# Patient Record
Sex: Female | Born: 1950 | Hispanic: Yes | Marital: Married | Smoking: Never smoker
Health system: Southern US, Community
[De-identification: ages and names within clinical notes are randomized; demographics above are authoritative.]

## PROBLEM LIST (undated history)

## (undated) DIAGNOSIS — C801 Malignant (primary) neoplasm, unspecified: Secondary | ICD-10-CM

## (undated) DIAGNOSIS — C50919 Malignant neoplasm of unspecified site of unspecified female breast: Secondary | ICD-10-CM

## (undated) DIAGNOSIS — N390 Urinary tract infection, site not specified: Secondary | ICD-10-CM

## (undated) DIAGNOSIS — K759 Inflammatory liver disease, unspecified: Secondary | ICD-10-CM

## (undated) HISTORY — DX: Urinary tract infection, site not specified: N39.0

## (undated) HISTORY — DX: Inflammatory liver disease, unspecified: K75.9

## (undated) HISTORY — PX: BREAST SURGERY: SHX581

## (undated) HISTORY — PX: REDUCTION MAMMAPLASTY: SUR839

## (undated) HISTORY — PX: AUGMENTATION MAMMAPLASTY: SUR837

## (undated) HISTORY — PX: APPENDECTOMY: SHX54

---

## 2008-11-12 DIAGNOSIS — Z833 Family history of diabetes mellitus: Secondary | ICD-10-CM | POA: Insufficient documentation

## 2008-11-12 DIAGNOSIS — I10 Essential (primary) hypertension: Secondary | ICD-10-CM | POA: Insufficient documentation

## 2008-12-29 DIAGNOSIS — M7710 Lateral epicondylitis, unspecified elbow: Secondary | ICD-10-CM | POA: Insufficient documentation

## 2008-12-29 DIAGNOSIS — M7520 Bicipital tendinitis, unspecified shoulder: Secondary | ICD-10-CM | POA: Insufficient documentation

## 2011-05-10 HISTORY — PX: MASTECTOMY: SHX3

## 2012-02-20 DIAGNOSIS — R7302 Impaired glucose tolerance (oral): Secondary | ICD-10-CM | POA: Insufficient documentation

## 2013-12-16 DIAGNOSIS — M5430 Sciatica, unspecified side: Secondary | ICD-10-CM | POA: Insufficient documentation

## 2013-12-16 DIAGNOSIS — M51369 Other intervertebral disc degeneration, lumbar region without mention of lumbar back pain or lower extremity pain: Secondary | ICD-10-CM | POA: Insufficient documentation

## 2013-12-16 DIAGNOSIS — M50321 Other cervical disc degeneration at C4-C5 level: Secondary | ICD-10-CM | POA: Insufficient documentation

## 2013-12-16 DIAGNOSIS — M545 Low back pain, unspecified: Secondary | ICD-10-CM | POA: Insufficient documentation

## 2015-05-06 DIAGNOSIS — E66811 Obesity, class 1: Secondary | ICD-10-CM | POA: Insufficient documentation

## 2015-11-04 DIAGNOSIS — G8929 Other chronic pain: Secondary | ICD-10-CM | POA: Insufficient documentation

## 2016-08-04 DIAGNOSIS — M150 Primary generalized (osteo)arthritis: Secondary | ICD-10-CM | POA: Insufficient documentation

## 2017-10-31 ENCOUNTER — Encounter (HOSPITAL_COMMUNITY): Payer: Self-pay

## 2017-10-31 ENCOUNTER — Emergency Department (HOSPITAL_COMMUNITY)
Admission: EM | Admit: 2017-10-31 | Discharge: 2017-11-01 | Disposition: A | Discharge status: Home / Self Care | Payer: BC Managed Care – PPO | Source: Home / Self Care | Attending: Emergency Medicine | Admitting: Emergency Medicine

## 2017-10-31 ENCOUNTER — Emergency Department (HOSPITAL_COMMUNITY): Payer: BC Managed Care – PPO

## 2017-10-31 DIAGNOSIS — Z853 Personal history of malignant neoplasm of breast: Secondary | ICD-10-CM | POA: Insufficient documentation

## 2017-10-31 DIAGNOSIS — J181 Lobar pneumonia, unspecified organism: Secondary | ICD-10-CM | POA: Insufficient documentation

## 2017-10-31 DIAGNOSIS — J189 Pneumonia, unspecified organism: Secondary | ICD-10-CM

## 2017-10-31 DIAGNOSIS — R000 Tachycardia, unspecified: Secondary | ICD-10-CM | POA: Insufficient documentation

## 2017-10-31 HISTORY — DX: Malignant (primary) neoplasm, unspecified: C80.1

## 2017-10-31 LAB — COMPREHENSIVE METABOLIC PANEL
ALBUMIN: 3.5 g/dL (ref 3.5–5.0)
ALT: 18 U/L (ref 0–44)
ANION GAP: 9 (ref 5–15)
AST: 20 U/L (ref 15–41)
Alkaline Phosphatase: 61 U/L (ref 38–126)
BUN: 8 mg/dL (ref 8–23)
CO2: 20 mmol/L — AB (ref 22–32)
Calcium: 8.6 mg/dL — ABNORMAL LOW (ref 8.9–10.3)
Chloride: 103 mmol/L (ref 98–111)
Creatinine, Ser: 0.75 mg/dL (ref 0.44–1.00)
GFR calc non Af Amer: >60 mL/min (ref >60)
Glucose, Bld: 141 mg/dL — ABNORMAL HIGH (ref 70–99)
Potassium: 3.7 mmol/L (ref 3.5–5.1)
SODIUM: 132 mmol/L — AB (ref 135–145)
Total Bilirubin: 1.1 mg/dL (ref 0.3–1.2)
Total Protein: 7.4 g/dL (ref 6.5–8.1)

## 2017-10-31 LAB — CBC WITH DIFFERENTIAL/PLATELET
ABS IMMATURE GRANULOCYTES: 0.1 10*3/uL (ref 0.0–0.1)
Basophils Absolute: 0 10*3/uL (ref 0.0–0.1)
Basophils Relative: 0 %
Eosinophils Absolute: 0 10*3/uL (ref 0.0–0.7)
Eosinophils Relative: 0 %
HEMATOCRIT: 43.3 % (ref 36.0–46.0)
HEMOGLOBIN: 14.3 g/dL (ref 12.0–15.0)
IMMATURE GRANULOCYTES: 1 %
LYMPHS ABS: 1.1 10*3/uL (ref 0.7–4.0)
Lymphocytes Relative: 11 %
MCH: 28.4 pg (ref 26.0–34.0)
MCHC: 33 g/dL (ref 30.0–36.0)
MCV: 85.9 fL (ref 78.0–100.0)
Monocytes Absolute: 0.8 10*3/uL (ref 0.1–1.0)
Monocytes Relative: 8 %
NEUTROS ABS: 8.5 10*3/uL — AB (ref 1.7–7.7)
NEUTROS PCT: 80 %
PLATELETS: 212 10*3/uL (ref 150–400)
RBC: 5.04 MIL/uL (ref 3.87–5.11)
RDW: 13.6 % (ref 11.5–15.5)
WBC: 10.5 10*3/uL (ref 4.0–10.5)

## 2017-10-31 LAB — I-STAT CG4 LACTIC ACID, ED: Lactic Acid, Venous: 1.01 mmol/L (ref 0.5–1.9)

## 2017-10-31 LAB — PROTIME-INR
INR: 1.13
PROTHROMBIN TIME: 14.4 s (ref 11.4–15.2)

## 2017-10-31 MED ORDER — SODIUM CHLORIDE 0.9 % IV BOLUS
1000.0000 mL | Freq: Once | INTRAVENOUS | Status: AC
  Administered 2017-10-31: 1000 mL via INTRAVENOUS

## 2017-10-31 MED ORDER — AZITHROMYCIN 250 MG PO TABS
500.0000 mg | ORAL_TABLET | Freq: Once | ORAL | Status: AC
  Administered 2017-10-31: 500 mg via ORAL
  Filled 2017-10-31: qty 2

## 2017-10-31 MED ORDER — IBUPROFEN 200 MG PO TABS
600.0000 mg | ORAL_TABLET | Freq: Once | ORAL | Status: AC
  Administered 2017-10-31: 600 mg via ORAL
  Filled 2017-10-31: qty 1

## 2017-10-31 MED ORDER — AZITHROMYCIN 250 MG PO TABS: 250.0000 mg | ORAL_TABLET | Freq: Every day | ORAL | 0 refills | Status: DC

## 2017-10-31 MED ORDER — SODIUM CHLORIDE 0.9 % IV SOLN
1.0000 g | Freq: Once | INTRAVENOUS | Status: AC
  Administered 2017-10-31: 1 g via INTRAVENOUS
  Filled 2017-10-31: qty 10

## 2017-10-31 MED ORDER — CEFPODOXIME PROXETIL 200 MG PO TABS: 200.0000 mg | ORAL_TABLET | Freq: Two times a day (BID) | ORAL | 0 refills | Status: DC

## 2017-10-31 NOTE — Discharge Instructions (Signed)
Your evaluated in the emergency department for 3 days of high fevers and headache.  You had lab work chest x-ray EKG and urinalysis and were found to have a left lower lobe pneumonia.  We gave you antibiotics here and are sending home with 2 prescriptions for antibiotics to finish.  You should continue to try to stay well-hydrated and keep the fevers under control with Tylenol and ibuprofen.  Please follow-up with your doctor and return if any worsening symptoms.

## 2017-10-31 NOTE — ED Notes (Signed)
Pt. To XRAY via stretcher. 

## 2017-10-31 NOTE — ED Triage Notes (Signed)
Pt states that since Sunday she has been having a fever, earache since Monday, denies urinary symptoms or cough. Fever 102 HR 132 Pt also states she has been sick on her stomach with nausea and upper abd pain and headache

## 2017-10-31 NOTE — ED Provider Notes (Signed)
[facility] EMERGENCY DEPARTMENT Provider Note   CSN: [id number] Arrival date & time: 10/31/17  2048     History   Chief Complaint Chief Complaint  Patient presents with  . Code Sepsis    HPI Alicia Delgado is a 67 y.o. female.  She presents with fever since Sunday.  She states she has had some earaches bilateral headache some nausea and fever up to 103.  Is been responsive to ibuprofen but after the ibuprofen wears off the fever spikes back up again.  She last had a dose of Tylenol at 8 PM and she is a temp of 102.5 here.  She has had no recent travel and no sick contacts.  No cough no abdominal pain no dysuria.  The history is provided by the patient.  Fever   This is a new problem. The current episode started 2 days ago. The problem occurs constantly. The problem has not changed since onset.The maximum temperature noted was 103 to 104 F. Associated symptoms include headaches. Pertinent negatives include no chest pain, no diarrhea, no vomiting, no congestion, no sore throat and no cough. She has tried acetaminophen and ibuprofen for the symptoms. The treatment provided mild relief.    Past Medical History:  Diagnosis Date  . Cancer [facility])    breast    There are no active problems to display for this patient.   Past Surgical History:  Procedure Laterality Date  . APPENDECTOMY    . BREAST SURGERY       OB History   None      Home Medications    Prior to Admission medications   Not on File    Family History No family history on file.  Social History Social History   Tobacco Use  . Smoking status: Never Smoker  . Smokeless tobacco: Never Used  Substance Use Topics  . Alcohol use: Never    Frequency: Never  . Drug use: Never     Allergies   Morphine and related   Review of Systems Review of Systems  Constitutional: Positive for fever. Negative for chills.  HENT: Positive for ear pain. Negative for congestion, ear discharge  and sore throat.   Eyes: Negative for pain and visual disturbance.  Respiratory: Negative for cough.   Cardiovascular: Negative for chest pain.  Gastrointestinal: Negative for diarrhea and vomiting.  Genitourinary: Negative for dysuria and hematuria.  Musculoskeletal: Negative for back pain and neck pain.  Skin: Negative for rash and wound.  Neurological: Positive for headaches. Negative for syncope.     Physical Exam Updated Vital Signs BP (!) 154/91   Pulse (!) 132   Temp (!) 102.5 F (39.2 C) (Oral)   Resp 20   SpO2 100%   Physical Exam  Constitutional: She appears well-developed and well-nourished. No distress.  HENT:  Head: Normocephalic and atraumatic.  Right Ear: Tympanic membrane and ear canal normal.  Left Ear: Tympanic membrane and ear canal normal.  Mouth/Throat: Uvula is midline and oropharynx is clear and moist. No oropharyngeal exudate, posterior oropharyngeal edema or posterior oropharyngeal erythema.  Eyes: Conjunctivae are normal.  Neck: Full passive range of motion without pain. Neck supple. No Brudzinski's sign and no Kernig's sign noted.  Cardiovascular: Regular rhythm, normal heart sounds and intact distal pulses. Tachycardia present.  No murmur heard. Pulmonary/Chest: Effort normal and breath sounds normal. No respiratory distress.  Abdominal: Soft. There is no tenderness.  Musculoskeletal: Normal range of motion. She exhibits no edema, tenderness or  deformity.  Neurological: She is alert.  Skin: Skin is warm and dry. Capillary refill takes less than 2 seconds.  Psychiatric: She has a normal mood and affect.  Nursing note and vitals reviewed.    ED Treatments / Results  Labs (all labs ordered are listed, but only abnormal results are displayed) Labs Reviewed  COMPREHENSIVE METABOLIC PANEL - Abnormal; Notable for the following components:      Result Value   Sodium 132 (*)    CO2 20 (*)    Glucose, Bld 141 (*)    Calcium 8.6 (*)    All other  components within normal limits  CBC WITH DIFFERENTIAL/PLATELET - Abnormal; Notable for the following components:   Neutro Abs 8.5 (*)    All other components within normal limits  URINALYSIS, ROUTINE W REFLEX MICROSCOPIC - Abnormal; Notable for the following components:   Hgb urine dipstick MODERATE (*)    Leukocytes, UA SMALL (*)    All other components within normal limits  CULTURE, BLOOD (ROUTINE X 2)  CULTURE, BLOOD (ROUTINE X 2)  PROTIME-INR  I-STAT CG4 LACTIC ACID, ED    EKG EKG Interpretation  Date/Time:  Tuesday October 31 2017 21:03:20 EDT Ventricular Rate:  115 PR Interval:  142 QRS Duration: 86 QT Interval:  326 QTC Calculation: 450 R Axis:   29 Text Interpretation:  Sinus tachycardia Cannot rule out Anterior infarct , age undetermined Abnormal ECG no prior to compare with Confirmed by Aletta Edouard [phone]) on 10/31/2017 11:52:46 PM   Radiology Dg Chest 2 View  Result Date: 10/31/2017 CLINICAL DATA:  67 year old female with fever. EXAM: CHEST - 2 VIEW COMPARISON:  None. FINDINGS: There is an area of slight increased density involving the left lower lobe concerning for developing infiltrate in the appropriate clinical setting. There is no pleural effusion or pneumothorax. The cardiac silhouette is within normal limits. Anterior mediastinal surgical clips. No acute osseous pathology. IMPRESSION: Findings concerning for developing infiltrate in the left lower lobe. Clinical correlation is recommended. Electronically Signed   By: Anner Crete M.D.   On: 10/31/2017 21:55    Procedures Procedures (including critical care time)  Medications Ordered in ED Medications  sodium chloride 0.9 % bolus 1,000 mL (0 mLs Intravenous Stopped 10/31/17 2326)  sodium chloride 0.9 % bolus 1,000 mL (0 mLs Intravenous Stopped 10/31/17 2325)  ibuprofen (ADVIL,MOTRIN) tablet 600 mg (600 mg Oral Given 10/31/17 2154)  cefTRIAXone (ROCEPHIN) 1 g in sodium chloride 0.9 % 100 mL IVPB (0 g  Intravenous Stopped 10/31/17 2325)  azithromycin (ZITHROMAX) tablet 500 mg (500 mg Oral Given 10/31/17 2218)     Initial Impression / Assessment and Plan / ED Course  I have reviewed the triage vital signs and the nursing notes.  Pertinent labs & imaging results that were available during my care of the patient were reviewed by me and considered in my medical decision making (see chart for details).  Clinical Course as of Nov 01 1113  Tue Oct 31, 2017  2351 Patient's vital signs of improved and she is feeling a lot better after fluid.  We are treating her pneumonia with ceftriaxone and Zithromax.  He feels well enough to go home just waiting on her urinalysis to result.   [MB]    Clinical Course User Index [MB] Hayden Rasmussen, MD     Final Clinical Impressions(s) / ED Diagnoses   Final diagnoses:  Pneumonia of left lower lobe due to infectious organism [facility])    ED Discharge Orders  Ordered    cefpodoxime (VANTIN) 200 MG tablet  2 times daily     10/31/17 2355    azithromycin (ZITHROMAX) 250 MG tablet  Daily     10/31/17 2355       Hayden Rasmussen, MD 11/01/17 1116

## 2017-11-01 ENCOUNTER — Inpatient Hospital Stay (HOSPITAL_COMMUNITY)
Admission: EM | Admit: 2017-11-01 | Discharge: 2017-11-04 | DRG: 194 | Disposition: A | Discharge status: Home / Self Care | Payer: BC Managed Care – PPO | Attending: Internal Medicine | Admitting: Internal Medicine

## 2017-11-01 ENCOUNTER — Other Ambulatory Visit: Payer: Self-pay

## 2017-11-01 ENCOUNTER — Encounter (HOSPITAL_COMMUNITY): Payer: Self-pay | Admitting: Emergency Medicine

## 2017-11-01 DIAGNOSIS — R112 Nausea with vomiting, unspecified: Secondary | ICD-10-CM | POA: Diagnosis present

## 2017-11-01 DIAGNOSIS — R739 Hyperglycemia, unspecified: Secondary | ICD-10-CM | POA: Diagnosis present

## 2017-11-01 DIAGNOSIS — J189 Pneumonia, unspecified organism: Secondary | ICD-10-CM | POA: Diagnosis present

## 2017-11-01 DIAGNOSIS — Z885 Allergy status to narcotic agent status: Secondary | ICD-10-CM

## 2017-11-01 DIAGNOSIS — R0981 Nasal congestion: Secondary | ICD-10-CM | POA: Diagnosis present

## 2017-11-01 DIAGNOSIS — Z853 Personal history of malignant neoplasm of breast: Secondary | ICD-10-CM

## 2017-11-01 DIAGNOSIS — N390 Urinary tract infection, site not specified: Secondary | ICD-10-CM | POA: Diagnosis present

## 2017-11-01 DIAGNOSIS — J181 Lobar pneumonia, unspecified organism: Principal | ICD-10-CM | POA: Diagnosis present

## 2017-11-01 DIAGNOSIS — Z7982 Long term (current) use of aspirin: Secondary | ICD-10-CM

## 2017-11-01 LAB — URINALYSIS, ROUTINE W REFLEX MICROSCOPIC
BILIRUBIN URINE: NEGATIVE
Bacteria, UA: NONE SEEN
Bilirubin Urine: NEGATIVE
GLUCOSE, UA: NEGATIVE mg/dL
Glucose, UA: NEGATIVE mg/dL
KETONES UR: NEGATIVE mg/dL
Ketones, ur: NEGATIVE mg/dL
Nitrite: NEGATIVE
Nitrite: NEGATIVE
PH: 6 (ref 5.0–8.0)
Protein, ur: 30 mg/dL — AB
Protein, ur: NEGATIVE mg/dL
SPECIFIC GRAVITY, URINE: 1.019 (ref 1.005–1.030)
Specific Gravity, Urine: 1.005 (ref 1.005–1.030)
pH: 7 (ref 5.0–8.0)

## 2017-11-01 LAB — CBC
HCT: 38.9 % (ref 36.0–46.0)
Hemoglobin: 12.9 g/dL (ref 12.0–15.0)
MCH: 28.4 pg (ref 26.0–34.0)
MCHC: 33.2 g/dL (ref 30.0–36.0)
MCV: 85.7 fL (ref 78.0–100.0)
PLATELETS: 210 10*3/uL (ref 150–400)
RBC: 4.54 MIL/uL (ref 3.87–5.11)
RDW: 13.7 % (ref 11.5–15.5)
WBC: 10.5 10*3/uL (ref 4.0–10.5)

## 2017-11-01 LAB — COMPREHENSIVE METABOLIC PANEL
ALBUMIN: 3 g/dL — AB (ref 3.5–5.0)
ALK PHOS: 52 U/L (ref 38–126)
ALT: 21 U/L (ref 0–44)
ANION GAP: 8 (ref 5–15)
AST: 22 U/L (ref 15–41)
BILIRUBIN TOTAL: 1 mg/dL (ref 0.3–1.2)
BUN: 6 mg/dL — ABNORMAL LOW (ref 8–23)
CALCIUM: 8.4 mg/dL — AB (ref 8.9–10.3)
CO2: 22 mmol/L (ref 22–32)
Chloride: 105 mmol/L (ref 98–111)
Creatinine, Ser: 0.75 mg/dL (ref 0.44–1.00)
Glucose, Bld: 126 mg/dL — ABNORMAL HIGH (ref 70–99)
POTASSIUM: 3.6 mmol/L (ref 3.5–5.1)
Sodium: 135 mmol/L (ref 135–145)
TOTAL PROTEIN: 6.8 g/dL (ref 6.5–8.1)

## 2017-11-01 LAB — LIPASE, BLOOD: Lipase: 31 U/L (ref 11–51)

## 2017-11-01 MED ORDER — ACETAMINOPHEN 500 MG PO TABS
1000.0000 mg | ORAL_TABLET | Freq: Once | ORAL | Status: AC
  Administered 2017-11-01: 1000 mg via ORAL
  Filled 2017-11-01: qty 2

## 2017-11-01 NOTE — ED Triage Notes (Signed)
Patient here recently for fever and nausea.  Patient states that she has taken tylenol for her fever but she continues with the fever.  She does have nausea and vomiting after taking the antibiotics that she was given.

## 2017-11-02 ENCOUNTER — Emergency Department (HOSPITAL_COMMUNITY): Payer: BC Managed Care – PPO

## 2017-11-02 ENCOUNTER — Encounter (HOSPITAL_COMMUNITY): Payer: Self-pay | Admitting: Internal Medicine

## 2017-11-02 ENCOUNTER — Other Ambulatory Visit: Payer: Self-pay

## 2017-11-02 DIAGNOSIS — R112 Nausea with vomiting, unspecified: Secondary | ICD-10-CM

## 2017-11-02 DIAGNOSIS — N390 Urinary tract infection, site not specified: Secondary | ICD-10-CM | POA: Diagnosis present

## 2017-11-02 DIAGNOSIS — Z853 Personal history of malignant neoplasm of breast: Secondary | ICD-10-CM | POA: Diagnosis not present

## 2017-11-02 DIAGNOSIS — J189 Pneumonia, unspecified organism: Secondary | ICD-10-CM | POA: Diagnosis not present

## 2017-11-02 DIAGNOSIS — Z7982 Long term (current) use of aspirin: Secondary | ICD-10-CM | POA: Diagnosis not present

## 2017-11-02 DIAGNOSIS — Z885 Allergy status to narcotic agent status: Secondary | ICD-10-CM | POA: Diagnosis not present

## 2017-11-02 DIAGNOSIS — J181 Lobar pneumonia, unspecified organism: Principal | ICD-10-CM

## 2017-11-02 DIAGNOSIS — R0981 Nasal congestion: Secondary | ICD-10-CM | POA: Diagnosis present

## 2017-11-02 DIAGNOSIS — R739 Hyperglycemia, unspecified: Secondary | ICD-10-CM | POA: Diagnosis present

## 2017-11-02 DIAGNOSIS — R51 Headache: Secondary | ICD-10-CM | POA: Diagnosis not present

## 2017-11-02 LAB — CBC WITH DIFFERENTIAL/PLATELET
BAND NEUTROPHILS: 16 %
BASOS PCT: 0 %
Basophils Absolute: 0 10*3/uL (ref 0.0–0.1)
Blasts: 0 %
EOS ABS: 0 10*3/uL (ref 0.0–0.7)
EOS PCT: 0 %
HCT: 33.8 % — ABNORMAL LOW (ref 36.0–46.0)
Hemoglobin: 11.3 g/dL — ABNORMAL LOW (ref 12.0–15.0)
LYMPHS ABS: 1.6 10*3/uL (ref 0.7–4.0)
LYMPHS PCT: 17 %
MCH: 28.3 pg (ref 26.0–34.0)
MCHC: 33.4 g/dL (ref 30.0–36.0)
MCV: 84.5 fL (ref 78.0–100.0)
MONO ABS: 0.3 10*3/uL (ref 0.1–1.0)
MONOS PCT: 3 %
Metamyelocytes Relative: 0 %
Myelocytes: 0 %
NEUTROS ABS: 7.4 10*3/uL (ref 1.7–7.7)
Neutrophils Relative %: 64 %
OTHER: 0 %
Platelets: 175 10*3/uL (ref 150–400)
Promyelocytes Relative: 0 %
RBC: 4 MIL/uL (ref 3.87–5.11)
RDW: 13.5 % (ref 11.5–15.5)
Smear Review: ADEQUATE
WBC: 9.3 10*3/uL (ref 4.0–10.5)
nRBC: 0 /100 WBC

## 2017-11-02 LAB — BASIC METABOLIC PANEL
ANION GAP: 8 (ref 5–15)
BUN: 6 mg/dL — ABNORMAL LOW (ref 8–23)
CHLORIDE: 104 mmol/L (ref 98–111)
CO2: 22 mmol/L (ref 22–32)
Calcium: 7.8 mg/dL — ABNORMAL LOW (ref 8.9–10.3)
Creatinine, Ser: 0.7 mg/dL (ref 0.44–1.00)
GFR calc Af Amer: >60 mL/min (ref >60)
GFR calc non Af Amer: >60 mL/min (ref >60)
GLUCOSE: 114 mg/dL — AB (ref 70–99)
POTASSIUM: 3.4 mmol/L — AB (ref 3.5–5.1)
Sodium: 134 mmol/L — ABNORMAL LOW (ref 135–145)

## 2017-11-02 LAB — HIV ANTIBODY (ROUTINE TESTING W REFLEX): HIV Screen 4th Generation wRfx: NONREACTIVE

## 2017-11-02 LAB — HEMOGLOBIN A1C
HEMOGLOBIN A1C: 5.8 % — AB (ref 4.8–5.6)
Mean Plasma Glucose: 119.76 mg/dL

## 2017-11-02 LAB — STREP PNEUMONIAE URINARY ANTIGEN: Strep Pneumo Urinary Antigen: NEGATIVE

## 2017-11-02 LAB — I-STAT CG4 LACTIC ACID, ED: LACTIC ACID, VENOUS: 0.83 mmol/L (ref 0.5–1.9)

## 2017-11-02 MED ORDER — ONDANSETRON HCL 4 MG PO TABS: 4.0000 mg | ORAL_TABLET | Freq: Four times a day (QID) | ORAL | Status: DC | PRN

## 2017-11-02 MED ORDER — ACETAMINOPHEN 650 MG RE SUPP: 650.0000 mg | Freq: Four times a day (QID) | RECTAL | Status: DC | PRN

## 2017-11-02 MED ORDER — SODIUM CHLORIDE 0.9 % IV SOLN
500.0000 mg | Freq: Once | INTRAVENOUS | Status: AC
  Administered 2017-11-02: 500 mg via INTRAVENOUS
  Filled 2017-11-02: qty 500

## 2017-11-02 MED ORDER — SODIUM CHLORIDE 0.9 % IV SOLN
INTRAVENOUS | Status: DC
  Administered 2017-11-02 – 2017-11-03 (×3): via INTRAVENOUS

## 2017-11-02 MED ORDER — POTASSIUM CHLORIDE CRYS ER 20 MEQ PO TBCR
40.0000 meq | EXTENDED_RELEASE_TABLET | Freq: Once | ORAL | Status: AC
  Administered 2017-11-02: 40 meq via ORAL
  Filled 2017-11-02: qty 2

## 2017-11-02 MED ORDER — ASPIRIN EC 81 MG PO TBEC
81.0000 mg | DELAYED_RELEASE_TABLET | Freq: Every day | ORAL | Status: DC
  Administered 2017-11-02 – 2017-11-04 (×3): 81 mg via ORAL
  Filled 2017-11-02 (×3): qty 1

## 2017-11-02 MED ORDER — ACETAMINOPHEN 325 MG PO TABS
650.0000 mg | ORAL_TABLET | Freq: Four times a day (QID) | ORAL | Status: DC | PRN
  Administered 2017-11-02 – 2017-11-03 (×4): 650 mg via ORAL
  Filled 2017-11-02 (×4): qty 2

## 2017-11-02 MED ORDER — SODIUM CHLORIDE 0.9 % IV SOLN
500.0000 mg | INTRAVENOUS | Status: DC
  Administered 2017-11-03 – 2017-11-04 (×2): 500 mg via INTRAVENOUS
  Filled 2017-11-02 (×2): qty 500

## 2017-11-02 MED ORDER — TRAMADOL HCL 50 MG PO TABS
50.0000 mg | ORAL_TABLET | Freq: Once | ORAL | Status: AC
  Administered 2017-11-02: 50 mg via ORAL
  Filled 2017-11-02: qty 1

## 2017-11-02 MED ORDER — SODIUM CHLORIDE 0.9 % IV SOLN
1.0000 g | INTRAVENOUS | Status: DC
  Administered 2017-11-02 – 2017-11-04 (×2): 1 g via INTRAVENOUS
  Filled 2017-11-02 (×2): qty 10

## 2017-11-02 MED ORDER — SODIUM CHLORIDE 0.9 % IV SOLN
1.0000 g | Freq: Once | INTRAVENOUS | Status: AC
  Administered 2017-11-02: 1 g via INTRAVENOUS
  Filled 2017-11-02: qty 10

## 2017-11-02 MED ORDER — SODIUM CHLORIDE 0.9 % IV BOLUS
1000.0000 mL | Freq: Once | INTRAVENOUS | Status: AC
  Administered 2017-11-02: 1000 mL via INTRAVENOUS

## 2017-11-02 MED ORDER — ENOXAPARIN SODIUM 40 MG/0.4ML ~~LOC~~ SOLN
40.0000 mg | SUBCUTANEOUS | Status: DC
  Administered 2017-11-02 – 2017-11-03 (×2): 40 mg via SUBCUTANEOUS
  Filled 2017-11-02 (×2): qty 0.4

## 2017-11-02 MED ORDER — ONDANSETRON HCL 4 MG/2ML IJ SOLN: 4.0000 mg | Freq: Four times a day (QID) | INTRAMUSCULAR | Status: DC | PRN

## 2017-11-02 NOTE — H&P (Signed)
History and Physical    [name] ZOX:[id number] DOB: January 13, 1951 DOA: 11/01/2017  PCP: Patient, No Pcp Per  Patient coming from: Home.  Chief Complaint: Fever and chills.  HPI: Alicia Delgado is a 67 y.o. female with breast cancer in remission presents to the ER with complaints of persistent fever and chills.  Also had 3 episodes of vomiting.  Patient had come to the ER a day ago with complaints of fever and is found to have pneumonia and was discharged home on Zithromax.  Patient did take 2 of the tablets following which patient started throwing up.  Unable to keep in anything.  Denies any abdominal pain or diarrhea.  Denies chest pain.  ED Course: In the ER patient is found to be tachycardic with temperatures running around 102.9 F.  UA shows features consistent with UTI and x-rays showed left lower lobe pneumonia.  Since patient is unable to keep in anything IV antibiotics were started and patient admitted for further management of pneumonia and possible UTI.  Review of Systems: As per HPI, rest all negative.   Past Medical History:  Diagnosis Date  . Cancer [facility])    breast    Past Surgical History:  Procedure Laterality Date  . APPENDECTOMY    . BREAST SURGERY       reports that she has never smoked. She has never used smokeless tobacco. She reports that she does not drink alcohol or use drugs.  Allergies  Allergen Reactions  . Morphine And Related Nausea And Vomiting    Family History  Problem Relation Age of Onset  . Diabetes Mellitus II Neg Hx     Prior to Admission medications   Medication Sig Start Date End Date Taking? Authorizing Provider  acetaminophen (TYLENOL) 500 MG tablet Take 1,000 mg by mouth every 6 (six) hours as needed (for pain or fever).   Yes [provider]  aspirin EC 81 MG tablet Take 81 mg by mouth daily.   Yes [provider]  azithromycin (ZITHROMAX) 250 MG tablet Take 1 tablet (250 mg total) by mouth  daily. 10/31/17  Yes Terrilee Files, MD  cefpodoxime (VANTIN) 200 MG tablet Take 1 tablet (200 mg total) by mouth 2 (two) times daily. 10/31/17  Yes Terrilee Files, MD  Lifitegrast Benay Spice) 5 % SOLN Place 1 drop into both eyes 2 (two) times daily.   Yes [provider]  magnesium 30 MG tablet Take 30 mg by mouth daily.   Yes [provider]  Multiple Vitamin (MULTIVITAMIN WITH MINERALS) TABS tablet Take 1 tablet by mouth daily.   Yes [provider]  vitamin B-12 (CYANOCOBALAMIN) 1000 MCG tablet Take 1,000 mcg by mouth daily.   Yes [provider]    Physical Exam: Vitals:   11/02/17 0224 11/02/17 0311 11/02/17 0317 11/02/17 0518  BP: (!) 108/59  (!) 106/53 (!) 128/105  Pulse: 85  87 96  Resp: (!) 21  16 16   Temp:   99 F (37.2 C) (!) 101.7 F (38.7 C)  TempSrc:      SpO2: 97%  100% 95%  Weight:  94 kg (207 lb 3.7 oz)    Height:  5\' 4"  (1.626 m)        Constitutional: Moderately built and nourished. Vitals:   11/02/17 0224 11/02/17 0311 11/02/17 0317 11/02/17 0518  BP: (!) 108/59  (!) 106/53 (!) 128/105  Pulse: 85  87 96  Resp: (!) 21  16 16  Temp:   99 F (37.2 C) (!) 101.7 F (38.7 C)  TempSrc:      SpO2: 97%  100% 95%  Weight:  94 kg (207 lb 3.7 oz)    Height:  5\' 4"  (1.626 m)     Eyes: Anicteric no pallor. ENMT: No discharge from the ears eyes nose or mouth. Neck: No mass palpated no neck rigidity.  No JVD appreciated. Respiratory: No rhonchi or crepitations. Cardiovascular: S1-S2 heard no murmurs appreciated. Abdomen: Soft nontender bowel sounds present. Musculoskeletal: No edema.  No joint effusion. Skin: No rash. Neurologic: Alert awake oriented to time place and person.  Moves all extremities. Psychiatric: Appears normal with a normal affect.   Labs on Admission: I have personally reviewed following labs and imaging studies  CBC: Recent Labs  Lab 10/31/17 2124 11/01/17 2207  WBC 10.5 10.5  NEUTROABS 8.5*  --     HGB 14.3 12.9  HCT 43.3 38.9  MCV 85.9 85.7  PLT 212 210   Basic Metabolic Panel: Recent Labs  Lab 10/31/17 2124 11/01/17 2207  NA 132* 135  K 3.7 3.6  CL 103 105  CO2 20* 22  GLUCOSE 141* 126*  BUN 8 6*  CREATININE 0.75 0.75  CALCIUM 8.6* 8.4*   GFR: Estimated Creatinine Clearance: 79 mL/min (by C-G formula based on SCr of 0.75 mg/dL). Liver Function Tests: Recent Labs  Lab 10/31/17 2124 11/01/17 2207  AST 20 22  ALT 18 21  ALKPHOS 61 52  BILITOT 1.1 1.0  PROT 7.4 6.8  ALBUMIN 3.5 3.0*   Recent Labs  Lab 11/01/17 2207  LIPASE 31   No results for input(s): AMMONIA in the last 168 hours. Coagulation Profile: Recent Labs  Lab 10/31/17 2124  INR 1.13   Cardiac Enzymes: No results for input(s): CKTOTAL, CKMB, CKMBINDEX, TROPONINI in the last 168 hours. BNP (last 3 results) No results for input(s): PROBNP in the last 8760 hours. HbA1C: No results for input(s): HGBA1C in the last 72 hours. CBG: No results for input(s): GLUCAP in the last 168 hours. Lipid Profile: No results for input(s): CHOL, HDL, LDLCALC, TRIG, CHOLHDL, LDLDIRECT in the last 72 hours. Thyroid Function Tests: No results for input(s): TSH, T4TOTAL, FREET4, T3FREE, THYROIDAB in the last 72 hours. Anemia Panel: No results for input(s): VITAMINB12, FOLATE, FERRITIN, TIBC, IRON, RETICCTPCT in the last 72 hours. Urine analysis:    Component Value Date/Time   COLORURINE YELLOW 11/01/2017 2211   APPEARANCEUR HAZY (A) 11/01/2017 2211   LABSPEC 1.019 11/01/2017 2211   PHURINE 6.0 11/01/2017 2211   GLUCOSEU NEGATIVE 11/01/2017 2211   HGBUR MODERATE (A) 11/01/2017 2211   BILIRUBINUR NEGATIVE 11/01/2017 2211   KETONESUR NEGATIVE 11/01/2017 2211   PROTEINUR 30 (A) 11/01/2017 2211   NITRITE NEGATIVE 11/01/2017 2211   LEUKOCYTESUR SMALL (A) 11/01/2017 2211   Sepsis Labs: @LABRCNTIP (procalcitonin:4,lacticidven:4) ) Recent Results (from the past 240 hour(s))  Culture, blood (Routine x 2)      Status: None (Preliminary result)   Collection Time: 10/31/17  9:02 PM  Result Value Ref Range Status   Specimen Description BLOOD RIGHT ANTECUBITAL  Final   Special Requests   Final    BOTTLES DRAWN AEROBIC AND ANAEROBIC Blood Culture adequate volume   Culture   Final    NO GROWTH < 24 HOURS Performed at [facility], [address] [place], [place] [address]    Report Status PENDING  Incomplete  Culture, blood (Routine x 2)     Status: None (  Preliminary result)   Collection Time: 10/31/17  9:07 PM  Result Value Ref Range Status   Specimen Description BLOOD LEFT ANTECUBITAL  Final   Special Requests   Final    BOTTLES DRAWN AEROBIC AND ANAEROBIC Blood Culture adequate volume   Culture   Final    NO GROWTH < 24 HOURS Performed at [facility], [address] [place], [place] [address]    Report Status PENDING  Incomplete     Radiological Exams on Admission: Dg Chest 2 View  Result Date: 11/02/2017 CLINICAL DATA:  Fever x4 days. EXAM: CHEST - 2 VIEW COMPARISON:  None. FINDINGS: Progression of left lower lobe pneumonia, slightly more confluent in appearance. Subsegmental atelectasis noted at the right lung base. Heart size is normal. No aortic aneurysm. IMPRESSION: Evolving pneumonia in the left lower lobe. Electronically Signed   By: Tollie Eth M.D.   On: 11/02/2017 02:16   Dg Chest 2 View  Result Date: 10/31/2017 CLINICAL DATA:  67 year old female with fever. EXAM: CHEST - 2 VIEW COMPARISON:  None. FINDINGS: There is an area of slight increased density involving the left lower lobe concerning for developing infiltrate in the appropriate clinical setting. There is no pleural effusion or pneumothorax. The cardiac silhouette is within normal limits. Anterior mediastinal surgical clips. No acute osseous pathology. IMPRESSION: Findings concerning for developing infiltrate in the left lower lobe. Clinical correlation is recommended. Electronically Signed   By: Elgie Collard  M.D.   On: 10/31/2017 21:55    EKG: Independently reviewed.  Done on October 31, 2088 was showing sinus tachycardia.  Assessment/Plan Principal Problem:   CAP (community acquired pneumonia) Active Problems:   Nausea & vomiting   Acute lower UTI    1. Community-acquired pneumonia with possible developing sepsis -patient placed on ceftriaxone and Zithromax.  Follow cultures.  Check urine for Legionella strep antigen.  Continue with hydration. 2. Possible UTI on ceftriaxone follow urine cultures. 3. Nausea vomiting likely from pneumonia and UTI.  Abdomen appears benign.  LFTs are normal.  Closely observe. 4. Hyperglycemia -65-year-old child has had a diagnosis of prediabetes we will check hemoglobin A1c.  Follow metabolic panel. 5. History of breast cancer in remission.   DVT prophylaxis: Lovenox. Code Status: Full code. Family Communication: Discussed with patient. Disposition Plan: Home. Consults called: None. Admission status: Inpatient.   Eduard Clos MD [facility] Pager [phone].  If 7PM-7AM, please contact night-coverage [email or web address] Password [facility]  11/02/2017, 5:35 AM

## 2017-11-02 NOTE — Progress Notes (Signed)
Chaplain presented to the patient, inquired of need for documentation on an Advance Directive. Patient states she just wanted to [facility] of the AD to see if it was something she wanted to do. She was instructed to inform the staff if she needed assistance if it was to follow up with completing it. Chaplain Yaakov Guthrie [phone]

## 2017-11-02 NOTE — ED Provider Notes (Signed)
[facility] EMERGENCY DEPARTMENT Provider Note   CSN: [id number] Arrival date & time: 11/01/17  2145     History   Chief Complaint Chief Complaint  Patient presents with  . Emesis  . Fever    HPI Alicia Delgado is a 67 y.o. female.  Patient presents to the emergency department for evaluation of persistent fever.  Patient reports that she was seen in the ER yesterday for fever, headache, general malaise.  She was diagnosed with community-acquired pneumonia.  Patient reports that over the course of today she has continued to have fever that did not completely respond to Tylenol.  She reports that she has had nausea and vomiting today, has not been able to hold down the antibiotics that she was prescribed.  He is not experiencing any abdominal pain.  No diarrhea.     Past Medical History:  Diagnosis Date  . Cancer [facility])    breast    There are no active problems to display for this patient.   Past Surgical History:  Procedure Laterality Date  . APPENDECTOMY    . BREAST SURGERY       OB History   None      Home Medications    Prior to Admission medications   Medication Sig Start Date End Date Taking? Authorizing Provider  aspirin EC 81 MG tablet Take 81 mg by mouth daily.    [provider]  azithromycin (ZITHROMAX) 250 MG tablet Take 1 tablet (250 mg total) by mouth daily. 10/31/17   Hayden Rasmussen, MD  cefpodoxime (VANTIN) 200 MG tablet Take 1 tablet (200 mg total) by mouth 2 (two) times daily. 10/31/17   Hayden Rasmussen, MD  Loteprednol-Tobramycin (ZYLET) 0.5-0.3 % SUSP Place 1 drop into both eyes 2 (two) times daily.    [provider]  magnesium 30 MG tablet Take 30 mg by mouth daily.    [provider]  metFORMIN (GLUCOPHAGE) 500 MG tablet Take 500 mg by mouth daily with breakfast.    [provider]  Multiple Vitamin (MULTIVITAMIN WITH MINERALS) TABS tablet Take 1 tablet by mouth daily.     [provider]  vitamin B-12 (CYANOCOBALAMIN) 1000 MCG tablet Take 1,000 mcg by mouth daily.    [provider]    Family History No family history on file.  Social History Social History   Tobacco Use  . Smoking status: Never Smoker  . Smokeless tobacco: Never Used  Substance Use Topics  . Alcohol use: Never    Frequency: Never  . Drug use: Never     Allergies   Morphine and related   Review of Systems Review of Systems  Constitutional: Positive for chills and fever.  Gastrointestinal: Positive for nausea and vomiting.  Neurological: Positive for headaches.  All other systems reviewed and are negative.    Physical Exam Updated Vital Signs BP 108/69   Pulse 87   Temp 99.7 F (37.6 C) (Oral)   Resp (!) 26   SpO2 94%   Physical Exam  Constitutional: She is oriented to person, place, and time. She appears well-developed and well-nourished. No distress.  HENT:  Head: Normocephalic and atraumatic.  Right Ear: Hearing normal.  Left Ear: Hearing normal.  Nose: Nose normal.  Mouth/Throat: Oropharynx is clear and moist and mucous membranes are normal.  Eyes: Pupils are equal, round, and reactive to light. Conjunctivae and EOM are normal.  Neck: Normal range of motion. Neck supple. No Brudzinski's sign and  no Kernig's sign noted.  Cardiovascular: Regular rhythm, S1 normal and S2 normal. Exam reveals no gallop and no friction rub.  No murmur heard. Pulmonary/Chest: Effort normal and breath sounds normal. No respiratory distress. She exhibits no tenderness.  Abdominal: Soft. Normal appearance and bowel sounds are normal. There is no hepatosplenomegaly. There is no tenderness. There is no rebound, no guarding, no tenderness at McBurney's point and negative Murphy's sign. No hernia.  Musculoskeletal: Normal range of motion.  Neurological: She is alert and oriented to person, place, and time. She has normal strength. No cranial nerve deficit or sensory  deficit. Coordination normal. GCS eye subscore is 4. GCS verbal subscore is 5. GCS motor subscore is 6.  Skin: Skin is warm, dry and intact. No rash noted. No cyanosis.  Psychiatric: She has a normal mood and affect. Her speech is normal and behavior is normal. Thought content normal.  Nursing note and vitals reviewed.    ED Treatments / Results  Labs (all labs ordered are listed, but only abnormal results are displayed) Labs Reviewed  COMPREHENSIVE METABOLIC PANEL - Abnormal; Notable for the following components:      Result Value   Glucose, Bld 126 (*)    BUN 6 (*)    Calcium 8.4 (*)    Albumin 3.0 (*)    All other components within normal limits  URINALYSIS, ROUTINE W REFLEX MICROSCOPIC - Abnormal; Notable for the following components:   APPearance HAZY (*)    Hgb urine dipstick MODERATE (*)    Protein, ur 30 (*)    Leukocytes, UA SMALL (*)    Bacteria, UA RARE (*)    All other components within normal limits  CULTURE, BLOOD (ROUTINE X 2)  CULTURE, BLOOD (ROUTINE X 2)  URINE CULTURE  LIPASE, BLOOD  CBC  I-STAT CG4 LACTIC ACID, ED    EKG None  Radiology Dg Chest 2 View  Result Date: 10/31/2017 CLINICAL DATA:  67 year old female with fever. EXAM: CHEST - 2 VIEW COMPARISON:  None. FINDINGS: There is an area of slight increased density involving the left lower lobe concerning for developing infiltrate in the appropriate clinical setting. There is no pleural effusion or pneumothorax. The cardiac silhouette is within normal limits. Anterior mediastinal surgical clips. No acute osseous pathology. IMPRESSION: Findings concerning for developing infiltrate in the left lower lobe. Clinical correlation is recommended. Electronically Signed   By: Anner Crete M.D.   On: 10/31/2017 21:55    Procedures Procedures (including critical care time)  Medications Ordered in ED Medications  sodium chloride 0.9 % bolus 1,000 mL (1,000 mLs Intravenous New Bag/Given 11/02/17 0109)    cefTRIAXone (ROCEPHIN) 1 g in sodium chloride 0.9 % 100 mL IVPB (has no administration in time range)  azithromycin (ZITHROMAX) 500 mg in sodium chloride 0.9 % 250 mL IVPB (has no administration in time range)  acetaminophen (TYLENOL) tablet 1,000 mg (1,000 mg Oral Given 11/01/17 2201)     Initial Impression / Assessment and Plan / ED Course  I have reviewed the triage vital signs and the nursing notes.  Pertinent labs & imaging results that were available during my care of the patient were reviewed by me and considered in my medical decision making (see chart for details).     Patient presents to the emergency department for evaluation of persistent fever.  Patient was seen in the ER yesterday and diagnosed with community-acquired pneumonia.  She was prescribed outpatient antibiotics after administration of Rocephin and Zithromax in the ER.  Patient did initiate Vantin and Zithromax at home, however, she developed nausea and vomiting and was unable to hold down the medications.  Fever has not been completely responding to Tylenol.  Patient does not appear to have any severe sepsis at this time.  She did have a high fever, but no hypotension, lactic acid is normal.  Based on her inability to take her outpatient medications, however, will require hospitalization for further management.  Final Clinical Impressions(s) / ED Diagnoses   Final diagnoses:  Community acquired pneumonia of left lower lobe of lung [facility])    ED Discharge Orders    None       Brigitte Soderberg, Gwenyth Allegra, MD 11/02/17 0200

## 2017-11-02 NOTE — Progress Notes (Signed)
[facility] PROGRESS NOTE  Alicia Delgado QMV:[id number] DOB: Apr 23, 1951 DOA: 11/01/2017  PCP: Patient, No Pcp Per  Brief History/Interval Summary: 67 year old female with a past medical history of cancer in remission.  She presented to the emergency department with fever chills.  She was diagnosed with pneumonia and was discharged from the emergency department on oral antibiotics.  However patient subsequently developed nausea vomiting after taking these antibiotics.  Could not keep them down so she presented back to the ED and then hospitalized.  Reason for Visit: Community acquired pneumonia  Consultants: None  Procedures: None  Antibiotics: Ceftriaxone and azithromycin  Subjective/Interval History: Patient states that she feels better this morning.  No further episodes of nausea vomiting since last night.  Requesting something to eat.  Denies any shortness of breath.  Does have a cough with yellowish expectoration.  No shortness of breath.  ROS: Denies any chest discomfort  Objective:  Vital Signs  Vitals:   11/02/17 0311 11/02/17 0317 11/02/17 0518 11/02/17 0700  BP:  (!) 106/53 (!) 128/105 (!) 107/53  Pulse:  87 96 84  Resp:  16 16   Temp:  99 F (37.2 C) (!) 101.7 F (38.7 C) 99.1 F (37.3 C)  TempSrc:    Oral  SpO2:  100% 95%   Weight: 94 kg (207 lb 3.7 oz)     Height: 5\' 4"  (1.626 m)       Intake/Output Summary (Last 24 hours) at 11/02/2017 1059 Last data filed at 11/02/2017 0555 Gross per 24 hour  Intake 1656.4 ml  Output -  Net 1656.4 ml   Filed Weights   11/02/17 0311  Weight: 94 kg (207 lb 3.7 oz)    General appearance: alert, cooperative, appears stated age and no distress Head: Normocephalic, without obvious abnormality, atraumatic Resp: Diminished air entry at the bases with a few crackles bilaterally.  No rhonchi no wheezing.  Normal effort at rest. Cardio: regular rate and rhythm, S1, S2 normal, no murmur, click, rub or  gallop GI: soft, non-tender; bowel sounds normal; no masses,  no organomegaly Extremities: extremities normal, atraumatic, no cyanosis or edema Neurologic: No focal neurological deficits  Lab Results:  Data Reviewed: I have personally reviewed following labs and imaging studies  CBC: Recent Labs  Lab 10/31/17 2124 11/01/17 2207 11/02/17 0618  WBC 10.5 10.5 9.3  NEUTROABS 8.5*  --  7.4  HGB 14.3 12.9 11.3*  HCT 43.3 38.9 33.8*  MCV 85.9 85.7 84.5  PLT 212 210 175    Basic Metabolic Panel: Recent Labs  Lab 10/31/17 2124 11/01/17 2207 11/02/17 0618  NA 132* 135 134*  K 3.7 3.6 3.4*  CL 103 105 104  CO2 20* 22 22  GLUCOSE 141* 126* 114*  BUN 8 6* 6*  CREATININE 0.75 0.75 0.70  CALCIUM 8.6* 8.4* 7.8*    GFR: Estimated Creatinine Clearance: 79 mL/min (by C-G formula based on SCr of 0.7 mg/dL).  Liver Function Tests: Recent Labs  Lab 10/31/17 2124 11/01/17 2207  AST 20 22  ALT 18 21  ALKPHOS 61 52  BILITOT 1.1 1.0  PROT 7.4 6.8  ALBUMIN 3.5 3.0*    Recent Labs  Lab 11/01/17 2207  LIPASE 31   Coagulation Profile: Recent Labs  Lab 10/31/17 2124  INR 1.13    HbA1C: Recent Labs    11/02/17 0618  HGBA1C 5.8*    Recent Results (from the past 240 hour(s))  Culture, blood (Routine x 2)     Status:  None (Preliminary result)   Collection Time: 10/31/17  9:02 PM  Result Value Ref Range Status   Specimen Description BLOOD RIGHT ANTECUBITAL  Final   Special Requests   Final    BOTTLES DRAWN AEROBIC AND ANAEROBIC Blood Culture adequate volume   Culture   Final    NO GROWTH 2 DAYS Performed at [facility], [address] [place], [place] [address]    Report Status PENDING  Incomplete  Culture, blood (Routine x 2)     Status: None (Preliminary result)   Collection Time: 10/31/17  9:07 PM  Result Value Ref Range Status   Specimen Description BLOOD LEFT ANTECUBITAL  Final   Special Requests   Final    BOTTLES DRAWN AEROBIC AND ANAEROBIC Blood  Culture adequate volume   Culture   Final    NO GROWTH 2 DAYS Performed at [facility], [address], [place], [place] [address]    Report Status PENDING  Incomplete      Radiology Studies: Dg Chest 2 View  Result Date: 11/02/2017 CLINICAL DATA:  Fever x4 days. EXAM: CHEST - 2 VIEW COMPARISON:  None. FINDINGS: Progression of left lower lobe pneumonia, slightly more confluent in appearance. Subsegmental atelectasis noted at the right lung base. Heart size is normal. No aortic aneurysm. IMPRESSION: Evolving pneumonia in the left lower lobe. Electronically Signed   By: Tollie Eth M.D.   On: 11/02/2017 02:16   Dg Chest 2 View  Result Date: 10/31/2017 CLINICAL DATA:  67 year old female with fever. EXAM: CHEST - 2 VIEW COMPARISON:  None. FINDINGS: There is an area of slight increased density involving the left lower lobe concerning for developing infiltrate in the appropriate clinical setting. There is no pleural effusion or pneumothorax. The cardiac silhouette is within normal limits. Anterior mediastinal surgical clips. No acute osseous pathology. IMPRESSION: Findings concerning for developing infiltrate in the left lower lobe. Clinical correlation is recommended. Electronically Signed   By: Elgie Collard M.D.   On: 10/31/2017 21:55     Medications:  Scheduled: . aspirin EC  81 mg Oral Daily  . enoxaparin (LOVENOX) injection  40 mg Subcutaneous Q24H  . potassium chloride  40 mEq Oral Once   Continuous: . sodium chloride 100 mL/hr at 11/02/17 0606  . [START ON 11/03/2017] azithromycin    . [START ON 11/03/2017] cefTRIAXone (ROCEPHIN)  IV     JJO:[id number] **OR** acetaminophen, ondansetron **OR** ondansetron (ZOFRAN) IV  Assessment/Plan:    Community-acquired pneumonia No clear evidence of sepsis at this time. Lactic Acid level was normal.  Heart rate is normal.  Blood pressure somewhat on the lower side.  Asymptomatic.  Patient seems to have improved.  She did have a  fever of 101.7 degrees early this morning.  Continue IV antibiotics for now.  Want to be certain that she does not have any further episodes of nausea and vomiting before transitioning her back to oral antibiotics. Follow-up on cultures.  Possible UTI UA was noted to be abnormal. Patient denies any symptoms per se.  Follow-up on urine cultures.  Continue antibiotics as discussed above.  Nausea and vomiting Most likely due to pneumonia.  Medications could have also contributed as her symptoms started after she was initiated on her antibiotics.  She was prescribed Vantin and azithromycin by the ED.  No further episodes since last night.  We will place her on a diet and watch for now.  History of breast cancer Discussed with remission.  Mild hyperglycemia  HbA1c 5.8.  Outpatient monitoring.  DVT Prophylaxis: Lovenox    Code Status: Full code Family Communication: Discussed with the patient Disposition Plan: Management as outlined above.    LOS: 0 days   Osvaldo Shipper  [facility] Pager [phone] 11/02/2017, 10:59 AM  If 7PM-7AM, please contact night-coverage at [email or web address], password [facility]

## 2017-11-02 NOTE — Progress Notes (Signed)
Pt is complaining of headache of 7/10. NPO. No meds on file. Provider on call notified. Will continue to monitor.

## 2017-11-03 ENCOUNTER — Inpatient Hospital Stay (HOSPITAL_COMMUNITY): Payer: BC Managed Care – PPO

## 2017-11-03 DIAGNOSIS — R51 Headache: Secondary | ICD-10-CM

## 2017-11-03 LAB — URINE CULTURE: CULTURE: NO GROWTH

## 2017-11-03 LAB — CBC
HEMATOCRIT: 33 % — AB (ref 36.0–46.0)
HEMOGLOBIN: 10.8 g/dL — AB (ref 12.0–15.0)
MCH: 28.1 pg (ref 26.0–34.0)
MCHC: 32.7 g/dL (ref 30.0–36.0)
MCV: 85.9 fL (ref 78.0–100.0)
PLATELETS: 183 10*3/uL (ref 150–400)
RBC: 3.84 MIL/uL — AB (ref 3.87–5.11)
RDW: 14 % (ref 11.5–15.5)
WBC: 6.9 10*3/uL (ref 4.0–10.5)

## 2017-11-03 LAB — BASIC METABOLIC PANEL
ANION GAP: 7 (ref 5–15)
BUN: <5 mg/dL — ABNORMAL LOW (ref 8–23)
CALCIUM: 7.9 mg/dL — AB (ref 8.9–10.3)
CO2: 24 mmol/L (ref 22–32)
Chloride: 106 mmol/L (ref 98–111)
Creatinine, Ser: 0.69 mg/dL (ref 0.44–1.00)
GLUCOSE: 125 mg/dL — AB (ref 70–99)
Potassium: 3.7 mmol/L (ref 3.5–5.1)
Sodium: 137 mmol/L (ref 135–145)

## 2017-11-03 MED ORDER — IBUPROFEN 600 MG PO TABS
600.0000 mg | ORAL_TABLET | Freq: Three times a day (TID) | ORAL | Status: DC | PRN
Start: 2017-11-03 — End: 2017-11-04
  Administered 2017-11-03 – 2017-11-04 (×2): 600 mg via ORAL
  Filled 2017-11-03 (×2): qty 1

## 2017-11-03 MED ORDER — SODIUM CHLORIDE 0.9 % IV SOLN
INTRAVENOUS | Status: DC
  Administered 2017-11-04: 500 mL via INTRAVENOUS

## 2017-11-03 MED ORDER — GUAIFENESIN ER 600 MG PO TB12
600.0000 mg | ORAL_TABLET | Freq: Two times a day (BID) | ORAL | Status: DC
  Administered 2017-11-03 – 2017-11-04 (×3): 600 mg via ORAL
  Filled 2017-11-03 (×3): qty 1

## 2017-11-03 MED ORDER — SODIUM CHLORIDE 0.9 % IV SOLN: INTRAVENOUS | Status: DC

## 2017-11-03 NOTE — Care Management Note (Signed)
Case Management Note  Patient Details  Name: Alicia Delgado MRN: [id number] Date of Birth: [birth year]  Subjective/Objective:     Admitted with CAP.  From home with husband, Percell Miller. Independent with ADL's, no DME usage.           Beather Arbour (Spouse)     [phone]       Action/Plan: Transition to home when medically stable.  Pt without PCP, NCM scheduled appointment for 11/17/2017 @ 2pm with [facility]. Pt made aware per NCM. NCM will continue to monitor for d/c needs.  Expected Discharge Date:                  Expected Discharge Plan:  Home/Self Care(Resides with husband)  In-House Referral:     Discharge planning Services  CM Consult  Post Acute Care Choice:    Choice offered to:     DME Arranged:    DME Agency:     HH Arranged:    HH Agency:     Status of Service:  In process, will continue to follow  If discussed at Long Length of Stay Meetings, dates discussed:    Additional Comments:  Taylon, [place], RN 11/03/2017, 8:38 AM

## 2017-11-03 NOTE — Progress Notes (Signed)
[facility] PROGRESS NOTE  Alicia Delgado ZOX:[id number] DOB: 1951-04-29 DOA: 11/01/2017  PCP: Patient, No Pcp Per  Brief History/Interval Summary: 67 year old female with a past medical history of cancer in remission.  She presented to the emergency department with fever chills.  She was diagnosed with pneumonia and was discharged from the emergency department on oral antibiotics.  However patient subsequently developed nausea vomiting after taking these antibiotics.  Could not keep them down so she presented back to the ED and then hospitalized.  Reason for Visit: Community acquired pneumonia  Consultants: None  Procedures: None  Antibiotics: Ceftriaxone and azithromycin  Subjective/Interval History: Patient states that she has headache ongoing since Sunday.  Denies any visual disturbances.  Denies any weakness on any one set of her body.  No tingling or numbness.  Cough persists with yellowish expectoration.  Shortness of breath is slightly better.  Some nausea but no vomiting.  Tolerating her diet.    ROS: Denies chest pain.  Objective:  Vital Signs  Vitals:   11/02/17 2300 11/03/17 0224 11/03/17 0225 11/03/17 0436  BP:    (!) 107/49  Pulse:    80  Resp:    18  Temp: (!) 100.7 F (38.2 C) 98.8 F (37.1 C) 98 F (36.7 C) 99.5 F (37.5 C)  TempSrc: Oral Oral Axillary   SpO2:    97%  Weight:      Height:        Intake/Output Summary (Last 24 hours) at 11/03/2017 1017 Last data filed at 11/03/2017 0800 Gross per 24 hour  Intake 1722.69 ml  Output -  Net 1722.69 ml   Filed Weights   11/02/17 0311  Weight: 94 kg (207 lb 3.7 oz)    General appearance: Awake alert.  In no distress Resp: Diminished air entry at the bases with crackles left base more than right.  No wheezing.  No rhonchi.  Normal effort at rest.   Cardio: S1-S2 is normal regular.  No S3-S4.  No rubs murmurs or bruit GI: Abdomen is soft.  Nontender nondistended.  Bowel sounds are  present.  No masses organomegaly Extremities: No edema Neurologic: No focal neurological deficits.  Lab Results:  Data Reviewed: I have personally reviewed following labs and imaging studies  CBC: Recent Labs  Lab 10/31/17 2124 11/01/17 2207 11/02/17 0618 11/03/17 0455  WBC 10.5 10.5 9.3 6.9  NEUTROABS 8.5*  --  7.4  --   HGB 14.3 12.9 11.3* 10.8*  HCT 43.3 38.9 33.8* 33.0*  MCV 85.9 85.7 84.5 85.9  PLT 212 210 175 183    Basic Metabolic Panel: Recent Labs  Lab 10/31/17 2124 11/01/17 2207 11/02/17 0618 11/03/17 0455  NA 132* 135 134* 137  K 3.7 3.6 3.4* 3.7  CL 103 105 104 106  CO2 20* 22 22 24   GLUCOSE 141* 126* 114* 125*  BUN 8 6* 6* <5*  CREATININE 0.75 0.75 0.70 0.69  CALCIUM 8.6* 8.4* 7.8* 7.9*    GFR: Estimated Creatinine Clearance: 79 mL/min (by C-G formula based on SCr of 0.69 mg/dL).  Liver Function Tests: Recent Labs  Lab 10/31/17 2124 11/01/17 2207  AST 20 22  ALT 18 21  ALKPHOS 61 52  BILITOT 1.1 1.0  PROT 7.4 6.8  ALBUMIN 3.5 3.0*    Recent Labs  Lab 11/01/17 2207  LIPASE 31   Coagulation Profile: Recent Labs  Lab 10/31/17 2124  INR 1.13    HbA1C: Recent Labs    11/02/17 0618  HGBA1C  5.8*    Recent Results (from the past 240 hour(s))  Culture, blood (Routine x 2)     Status: None (Preliminary result)   Collection Time: 10/31/17  9:02 PM  Result Value Ref Range Status   Specimen Description BLOOD RIGHT ANTECUBITAL  Final   Special Requests   Final    BOTTLES DRAWN AEROBIC AND ANAEROBIC Blood Culture adequate volume   Culture   Final    NO GROWTH 3 DAYS Performed at [facility], [address] [place], [place] [address]    Report Status PENDING  Incomplete  Culture, blood (Routine x 2)     Status: None (Preliminary result)   Collection Time: 10/31/17  9:07 PM  Result Value Ref Range Status   Specimen Description BLOOD LEFT ANTECUBITAL  Final   Special Requests   Final    BOTTLES DRAWN AEROBIC AND ANAEROBIC  Blood Culture adequate volume   Culture   Final    NO GROWTH 3 DAYS Performed at  [facility], [address] [place], [place] [address]    Report Status PENDING  Incomplete  Culture, blood (Routine X 2) w Reflex to ID Panel     Status: None (Preliminary result)   Collection Time: 11/02/17  1:04 AM  Result Value Ref Range Status   Specimen Description BLOOD RIGHT ANTECUBITAL  Final   Special Requests   Final    BOTTLES DRAWN AEROBIC AND ANAEROBIC Blood Culture adequate volume   Culture   Final    NO GROWTH 1 DAY Performed at [facility], [address], [place], [place] [address]    Report Status PENDING  Incomplete  Culture, blood (Routine X 2) w Reflex to ID Panel     Status: None (Preliminary result)   Collection Time: 11/02/17  1:05 AM  Result Value Ref Range Status   Specimen Description BLOOD LEFT ANTECUBITAL  Final   Special Requests   Final    BOTTLES DRAWN AEROBIC AND ANAEROBIC Blood Culture adequate volume   Culture   Final    NO GROWTH 1 DAY Performed at [facility], [address], [place], [place] [address]    Report Status PENDING  Incomplete  Urine Culture     Status: None   Collection Time: 11/02/17  1:55 AM  Result Value Ref Range Status   Specimen Description URINE, RANDOM  Final   Special Requests NONE  Final   Culture   Final    NO GROWTH Performed at [facility], [address], [place], [place] [address]    Report Status 11/03/2017 FINAL  Final      Radiology Studies: Dg Chest 2 View  Result Date: 11/02/2017 CLINICAL DATA:  Fever x4 days. EXAM: CHEST - 2 VIEW COMPARISON:  None. FINDINGS: Progression of left lower lobe pneumonia, slightly more confluent in appearance. Subsegmental atelectasis noted at the right lung base. Heart size is normal. No aortic aneurysm. IMPRESSION: Evolving pneumonia in the left lower lobe. Electronically Signed   By: Tollie Eth M.D.   On: 11/02/2017 02:16     Medications:  Scheduled: .  aspirin EC  81 mg Oral Daily  . enoxaparin (LOVENOX) injection  40 mg Subcutaneous Q24H  . guaiFENesin  600 mg Oral BID   Continuous: . azithromycin Stopped (11/03/17 0322)  . cefTRIAXone (ROCEPHIN)  IV Stopped (11/02/17 2354)   OZD:[id number] **OR** acetaminophen, ondansetron **OR** ondansetron (ZOFRAN) IV  Assessment/Plan:    Community-acquired pneumonia No  clear evidence for sepsis now or even at the time of admission.  Lactic Acid level was normal.  Heart rate is normal.  Stable.  Continues to have low-grade fevers.  Continues to have a cough.  Continue IV antibiotics for now.  Add Mucinex.  Flutter valve.  Follow-up on cultures which are negative so far.  Nausea and vomiting appears to have subsided.  Headache is most likely due to her acute illness. She was reassured.  CT head unremarkable.  No neurological deficits.  Possible UTI UA was noted to be abnormal.  Patient without any urinary symptoms.  Cultures are negative.  Nausea and vomiting Most likely due to pneumonia.  Medications could have also contributed as her symptoms started after she was initiated on her antibiotics.  She was prescribed Vantin and azithromycin by the ED. She has not had any further nausea vomiting since hospitalization.  Tolerating her diet well.  Continue to monitor for now.    History of breast cancer Discussed with remission.  Mild hyperglycemia HbA1c 5.8.  Outpatient monitoring.  DVT Prophylaxis: Lovenox    Code Status: Full code Family Communication: Discussed with the patient Disposition Plan: Await further improvement.  Hopefully discharge in 1 to 2 days.    LOS: 1 day   Osvaldo Shipper  [facility] Pager [phone] 11/03/2017, 10:17 AM  If 7PM-7AM, please contact night-coverage at [email or web address], password [facility]

## 2017-11-03 NOTE — Progress Notes (Signed)
MD notified of patient's headache still not going away and asked if she can have ibuprofen or something stronger.

## 2017-11-04 MED ORDER — LEVOFLOXACIN 500 MG PO TABS: 500.0000 mg | ORAL_TABLET | Freq: Every day | ORAL | 0 refills | Status: AC

## 2017-11-04 MED ORDER — FLUTICASONE PROPIONATE 50 MCG/ACT NA SUSP: 2.0000 | Freq: Every day | NASAL | 2 refills | Status: DC

## 2017-11-04 MED ORDER — FLUTICASONE PROPIONATE 50 MCG/ACT NA SUSP
2.0000 | Freq: Every day | NASAL | Status: DC
  Administered 2017-11-04: 2 via NASAL
  Filled 2017-11-04: qty 16

## 2017-11-04 MED ORDER — IBUPROFEN 600 MG PO TABS: 600.0000 mg | ORAL_TABLET | Freq: Three times a day (TID) | ORAL | 0 refills | Status: DC | PRN

## 2017-11-04 MED ORDER — LEVOFLOXACIN 500 MG PO TABS
500.0000 mg | ORAL_TABLET | Freq: Every day | ORAL | Status: DC
  Administered 2017-11-04: 500 mg via ORAL
  Filled 2017-11-04: qty 1

## 2017-11-04 MED ORDER — GUAIFENESIN ER 600 MG PO TB12: 600.0000 mg | ORAL_TABLET | Freq: Two times a day (BID) | ORAL | 0 refills | Status: DC

## 2017-11-04 NOTE — Discharge Instructions (Signed)

## 2017-11-04 NOTE — Plan of Care (Signed)
Nsg Discharge Note  Admit Date:  11/01/2017 Discharge date: 11/04/2017   Elwyn Reach Underhill to be D/C'd Home per MD order.  AVS completed.  Copy for chart, and copy for patient signed, and dated. Patient/caregiver able to verbalize understanding.  Discharge Medication: Allergies as of 11/04/2017       Reactions   Morphine And Related Nausea And Vomiting        Medication List     STOP taking these medications    azithromycin 250 MG tablet Commonly known as:  ZITHROMAX   cefpodoxime 200 MG tablet Commonly known as:  VANTIN       TAKE these medications    acetaminophen 500 MG tablet Commonly known as:  TYLENOL Take 1,000 mg by mouth every 6 (six) hours as needed (for pain or fever).   aspirin EC 81 MG tablet Take 81 mg by mouth daily.   fluticasone 50 MCG/ACT nasal spray Commonly known as:  FLONASE Place 2 sprays into both nostrils daily. Start taking on:  11/05/2017   guaiFENesin 600 MG 12 hr tablet Commonly known as:  MUCINEX Take 1 tablet (600 mg total) by mouth 2 (two) times daily.   ibuprofen 600 MG tablet Commonly known as:  ADVIL,MOTRIN Take 1 tablet (600 mg total) by mouth 3 (three) times daily as needed for headache.   levofloxacin 500 MG tablet Commonly known as:  LEVAQUIN Take 1 tablet (500 mg total) by mouth daily for 5 days. Start taking on:  11/05/2017   magnesium 30 MG tablet Take 30 mg by mouth daily.   multivitamin with minerals Tabs tablet Take 1 tablet by mouth daily.   vitamin B-12 1000 MCG tablet Commonly known as:  CYANOCOBALAMIN Take 1,000 mcg by mouth daily.   XIIDRA 5 % Soln Generic drug:  Lifitegrast Place 1 drop into both eyes 2 (two) times daily.        Discharge Assessment: Vitals:   11/04/17 0418 11/04/17 1300  BP: (!) 102/51 109/63  Pulse: 78 85  Resp: 18 20  Temp: 99.1 F (37.3 C) 99.1 F (37.3 C)  SpO2: 98% 98%   Skin clean, dry and intact without evidence of skin break down, no evidence of skin tears  noted. IV catheter discontinued intact. Site without signs and symptoms of complications - no redness or edema noted at insertion site, patient denies c/o pain - only slight tenderness at site.  Dressing with slight pressure applied.  D/c Instructions-Education: Discharge instructions given to patient/family with verbalized understanding. D/c education completed with patient/family including follow up instructions, medication list, d/c activities limitations if indicated, with other d/c instructions as indicated by MD - patient able to verbalize understanding, all questions fully answered. Patient instructed to return to ED, call 911, or call MD for any changes in condition.  Patient escorted via [place], and D/C home via private auto.  Salley Slaughter, RN 11/04/2017 1:16 PM

## 2017-11-04 NOTE — Discharge Summary (Signed)
[facility]  Physician Discharge Summary   Patient ID: Alicia Delgado MRN: [id number] DOB/AGE: 65-Jan-1952 65 y.o.  Admit date: 11/01/2017 Discharge date: 11/04/2017  PCP: Patient, No Pcp Per  DISCHARGE DIAGNOSES:  Community-acquired pneumonia  RECOMMENDATIONS FOR OUTPATIENT FOLLOW UP: 1. Follow-up with PCP   DISCHARGE CONDITION: fair  Diet recommendation: As before  [facility] Weights   11/02/17 0311  Weight: 94 kg (207 lb 3.7 oz)    INITIAL HISTORY: 67 year old female with a past medical history of cancer in remission.  She presented to the emergency department with fever chills.  She was diagnosed with pneumonia and was discharged from the emergency department on oral antibiotics.  However patient subsequently developed nausea vomiting after taking these antibiotics.  Could not keep them down so she presented back to the ED and then hospitalized.  Consultations:  None  Procedures:  None   HOSPITAL COURSE:   Community-acquired pneumonia No clear evidence for sepsis now or even at the time of admission.  Lactic Acid level was normal.  Heart rate is normal.  Patient failed outpatient treatment due to onset of nausea vomiting.  She was placed on intravenous ceftriaxone and azithromycin.  She continues to feel better.  Still has a cough but improved.  Shortness of breath is improved.  She will be transitioned to oral antibiotic.  She was prescribed Levaquin.  Saturating normal on room air.  Okay for discharge home today.  Headache Most likely due to acute illness as well as possible sinus congestion.  Flonase nasal spray.  She did not have any focal neurological deficits.  No warning signs.  CT head was unremarkable.  Possible UTI UA was noted to be abnormal.  Patient without any urinary symptoms.  Cultures are negative.  Nausea and vomiting Most likely due to pneumonia.  Medications could have also contributed as her symptoms started after she was  initiated on her antibiotics.  She was prescribed Vantin and azithromycin by the ED. She has not had any further nausea vomiting since hospitalization.  Tolerating her diet well.    History of breast cancer In remission.  Mild hyperglycemia HbA1c 5.8.  Outpatient monitoring.  Stable.  Okay for discharge home today.    PERTINENT LABS:  The results of significant diagnostics from this hospitalization (including imaging, microbiology, ancillary and laboratory) are listed below for reference.    Microbiology: Recent Results (from the past 240 hour(s))  Culture, blood (Routine x 2)     Status: None (Preliminary result)   Collection Time: 10/31/17  9:02 PM  Result Value Ref Range Status   Specimen Description BLOOD RIGHT ANTECUBITAL  Final   Special Requests   Final    BOTTLES DRAWN AEROBIC AND ANAEROBIC Blood Culture adequate volume   Culture   Final    NO GROWTH 4 DAYS Performed at [facility], [address], [place], [place] [address]    Report Status PENDING  Incomplete  Culture, blood (Routine x 2)     Status: None (Preliminary result)   Collection Time: 10/31/17  9:07 PM  Result Value Ref Range Status   Specimen Description BLOOD LEFT ANTECUBITAL  Final   Special Requests   Final    BOTTLES DRAWN AEROBIC AND ANAEROBIC Blood Culture adequate volume   Culture   Final    NO GROWTH 4 DAYS Performed at [facility], [address] [place], [place] [address]    Report Status PENDING  Incomplete  Culture, blood (Routine X 2)  w Reflex to ID Panel     Status: None (Preliminary result)   Collection Time: 11/02/17  1:04 AM  Result Value Ref Range Status   Specimen Description BLOOD RIGHT ANTECUBITAL  Final   Special Requests   Final    BOTTLES DRAWN AEROBIC AND ANAEROBIC Blood Culture adequate volume   Culture   Final    NO GROWTH 2 DAYS Performed at [facility], [address], [place], [place] [address]    Report Status PENDING  Incomplete  Culture,  blood (Routine X 2) w Reflex to ID Panel     Status: None (Preliminary result)   Collection Time: 11/02/17  1:05 AM  Result Value Ref Range Status   Specimen Description BLOOD LEFT ANTECUBITAL  Final   Special Requests   Final    BOTTLES DRAWN AEROBIC AND ANAEROBIC Blood Culture adequate volume   Culture   Final    NO GROWTH 2 DAYS Performed at [facility], [address] [place], [place] [address]    Report Status PENDING  Incomplete  Urine Culture     Status: None   Collection Time: 11/02/17  1:55 AM  Result Value Ref Range Status   Specimen Description URINE, RANDOM  Final   Special Requests NONE  Final   Culture   Final    NO GROWTH Performed at [facility], [address], [place], [place] [address]    Report Status 11/03/2017 FINAL  Final     Labs: Basic Metabolic Panel: Recent Labs  Lab 10/31/17 2124 11/01/17 2207 11/02/17 0618 11/03/17 0455  NA 132* 135 134* 137  K 3.7 3.6 3.4* 3.7  CL 103 105 104 106  CO2 20* 22 22 24   GLUCOSE 141* 126* 114* 125*  BUN 8 6* 6* <5*  CREATININE 0.75 0.75 0.70 0.69  CALCIUM 8.6* 8.4* 7.8* 7.9*   Liver Function Tests: Recent Labs  Lab 10/31/17 2124 11/01/17 2207  AST 20 22  ALT 18 21  ALKPHOS 61 52  BILITOT 1.1 1.0  PROT 7.4 6.8  ALBUMIN 3.5 3.0*   Recent Labs  Lab 11/01/17 2207  LIPASE 31   CBC: Recent Labs  Lab 10/31/17 2124 11/01/17 2207 11/02/17 0618 11/03/17 0455  WBC 10.5 10.5 9.3 6.9  NEUTROABS 8.5*  --  7.4  --   HGB 14.3 12.9 11.3* 10.8*  HCT 43.3 38.9 33.8* 33.0*  MCV 85.9 85.7 84.5 85.9  PLT 212 210 175 183    IMAGING STUDIES Dg Chest 2 View  Result Date: 11/02/2017 CLINICAL DATA:  Fever x4 days. EXAM: CHEST - 2 VIEW COMPARISON:  None. FINDINGS: Progression of left lower lobe pneumonia, slightly more confluent in appearance. Subsegmental atelectasis noted at the right lung base. Heart size is normal. No aortic aneurysm. IMPRESSION: Evolving pneumonia in the left lower lobe.  Electronically Signed   By: Tollie Eth M.D.   On: 11/02/2017 02:16   Dg Chest 2 View  Result Date: 10/31/2017 CLINICAL DATA:  67 year old female with fever. EXAM: CHEST - 2 VIEW COMPARISON:  None. FINDINGS: There is an area of slight increased density involving the left lower lobe concerning for developing infiltrate in the appropriate clinical setting. There is no pleural effusion or pneumothorax. The cardiac silhouette is within normal limits. Anterior mediastinal surgical clips. No acute osseous pathology. IMPRESSION: Findings concerning for developing infiltrate in the left lower lobe. Clinical correlation is recommended. Electronically Signed   By: Ceasar Mons.D.  On: 10/31/2017 21:55   Ct Head Wo Contrast  Result Date: 11/03/2017 CLINICAL DATA:  Posterior headache for the past 6 days. EXAM: CT HEAD WITHOUT CONTRAST TECHNIQUE: Contiguous axial images were obtained from the base of the skull through the vertex without intravenous contrast. COMPARISON:  None. FINDINGS: Brain: No evidence of acute infarction, hemorrhage, hydrocephalus, extra-axial collection or mass lesion/mass effect. 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 Small CSF density lesion in the right middle cranial fossa, most consistent with an arachnoid cyst. Mild age related cerebral atrophy. Vascular: No hyperdense vessel or unexpected calcification. Skull: Normal. Negative for fracture or focal lesion. Sinuses/Orbits: No acute finding. Other: None. IMPRESSION: 1.  No acute intracranial abnormality. Electronically Signed   By: Obie Dredge M.D.   On: 11/03/2017 10:23    DISCHARGE EXAMINATION: Vitals:   11/03/17 1256 11/03/17 2131 11/04/17 0418 11/04/17 1300  BP: (!) 102/50 (!) 102/54 (!) 102/51 109/63  Pulse: 79 74 78 85  Resp: 19 18 18 20   Temp: 98.2 F (36.8 C) 99 F (37.2 C) 99.1 F (37.3 C) 99.1 F (37.3 C)  TempSrc: Oral  Oral Oral  SpO2: 96% 98% 98% 98%  Weight:      Height:       General  appearance: alert, cooperative, appears stated age and no distress Resp: Few crackles at the right base.  No wheezing or rhonchi.  Normal effort. Cardio: regular rate and rhythm, S1, S2 normal, no murmur, click, rub or gallop GI: soft, non-tender; bowel sounds normal; no masses,  no organomegaly Extremities: extremities normal, atraumatic, no cyanosis or edema  DISPOSITION: Home  Discharge Instructions    Call MD for:  difficulty breathing, headache or visual disturbances   Complete by:  As directed    Call MD for:  extreme fatigue   Complete by:  As directed    Call MD for:  persistant dizziness or light-headedness   Complete by:  As directed    Call MD for:  persistant nausea and vomiting   Complete by:  As directed    Call MD for:  severe uncontrolled pain   Complete by:  As directed    Call MD for:  temperature >100.4   Complete by:  As directed    Diet general   Complete by:  As directed    Discharge instructions   Complete by:  As directed    Please take your medications as prescribed.  Seek attention immediately if symptoms worsen.  You were cared for by a hospitalist during your hospital stay. If you have any questions about your discharge medications or the care you received while you were in the hospital after you are discharged, you can call the unit and asked to speak with the hospitalist on call if the hospitalist that took care of you is not available. Once you are discharged, your primary care physician will handle any further medical issues. Please note that NO REFILLS for any discharge medications will be authorized once you are discharged, as it is imperative that you return to your primary care physician (or establish a relationship with a primary care physician if you do not have one) for your aftercare needs so that they can reassess your need for medications and monitor your lab values. If you do not have a primary care physician, you can call [phone] for a physician  referral.   Increase activity slowly   Complete by:  As directed  Allergies as of 11/04/2017      Reactions   Morphine And Related Nausea And Vomiting      Medication List    STOP taking these medications   azithromycin 250 MG tablet Commonly known as:  ZITHROMAX   cefpodoxime 200 MG tablet Commonly known as:  VANTIN     TAKE these medications   acetaminophen 500 MG tablet Commonly known as:  TYLENOL Take 1,000 mg by mouth every 6 (six) hours as needed (for pain or fever).   aspirin EC 81 MG tablet Take 81 mg by mouth daily.   fluticasone 50 MCG/ACT nasal spray Commonly known as:  FLONASE Place 2 sprays into both nostrils daily. Start taking on:  11/05/2017   guaiFENesin 600 MG 12 hr tablet Commonly known as:  MUCINEX Take 1 tablet (600 mg total) by mouth 2 (two) times daily.   ibuprofen 600 MG tablet Commonly known as:  ADVIL,MOTRIN Take 1 tablet (600 mg total) by mouth 3 (three) times daily as needed for headache.   levofloxacin 500 MG tablet Commonly known as:  LEVAQUIN Take 1 tablet (500 mg total) by mouth daily for 5 days. Start taking on:  11/05/2017   magnesium 30 MG tablet Take 30 mg by mouth daily.   multivitamin with minerals Tabs tablet Take 1 tablet by mouth daily.   vitamin B-12 1000 MCG tablet Commonly known as:  CYANOCOBALAMIN Take 1,000 mcg by mouth daily.   XIIDRA 5 % Soln Generic drug:  Lifitegrast Place 1 drop into both eyes 2 (two) times daily.        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 Go on 11/17/2017.   Specialty:  Family Medicine Why:  2:00 pm, Dr. Abbe Amsterdam Contact information: [address] [place] [address] [phone]          TOTAL DISCHARGE TIME: 35 minutes  Osvaldo Shipper  [facility] Pager [phone]  11/04/2017, 2:32 PM

## 2017-11-05 LAB — CULTURE, BLOOD (ROUTINE X 2)
Culture: NO GROWTH
Culture: NO GROWTH
SPECIAL REQUESTS: ADEQUATE
Special Requests: ADEQUATE

## 2017-11-07 LAB — CULTURE, BLOOD (ROUTINE X 2)
Culture: NO GROWTH
Culture: NO GROWTH
SPECIAL REQUESTS: ADEQUATE
Special Requests: ADEQUATE

## 2017-11-17 ENCOUNTER — Encounter: Payer: Self-pay | Admitting: Family Medicine

## 2017-11-17 ENCOUNTER — Ambulatory Visit (INDEPENDENT_AMBULATORY_CARE_PROVIDER_SITE_OTHER): Payer: BC Managed Care – PPO | Admitting: Family Medicine

## 2017-11-17 VITALS — BP 118/80 | HR 90 | Temp 98.5°F | Ht 62.0 in | Wt 201.0 lb

## 2017-11-17 DIAGNOSIS — N898 Other specified noninflammatory disorders of vagina: Secondary | ICD-10-CM

## 2017-11-17 DIAGNOSIS — B379 Candidiasis, unspecified: Secondary | ICD-10-CM

## 2017-11-17 LAB — POC URINALSYSI DIPSTICK (AUTOMATED)
Bilirubin, UA: NEGATIVE
Glucose, UA: NEGATIVE
KETONES UA: NEGATIVE
LEUKOCYTES UA: NEGATIVE
NITRITE UA: NEGATIVE
PROTEIN UA: NEGATIVE
Spec Grav, UA: 1.015 (ref 1.010–1.025)
UROBILINOGEN UA: 0.2 U/dL
pH, UA: 6.5 (ref 5.0–8.0)

## 2017-11-17 MED ORDER — FLUCONAZOLE 150 MG PO TABS: 150.0000 mg | ORAL_TABLET | Freq: Once | ORAL | 0 refills | Status: AC

## 2017-11-17 NOTE — Progress Notes (Signed)
Subjective:    Patient ID: Alicia Delgado, female    DOB: 1950/11/29, 67 y.o.   MRN: [id number]  No chief complaint on file.   HPI Patient was seen today for f/u.  Pt is not est with this clinic and does not wish to est as the clinic is further from her house than she would like.  Pt states the appt was made in the hospital.  Pt was previously seen in [place], [place].  Pt was hospitalized for pneumonia from 6/26-6/29 after being unable to keep down po abx at home.  Pt d/c on levaquin.  Pt states she has been doing well since d/c.  She gets SOB/tired more than she normally would.  Pt denies fever, chills, cough, CP.  Pt mentions vaginal irritation.  She denies d/c, dysuria, back pain, frequency.  Past Medical History:  Diagnosis Date  . Cancer (HCC)    breast  . Hepatitis   . UTI (urinary tract infection)     Allergies  Allergen Reactions  . Morphine And Related Nausea And Vomiting    ROS General: Denies fever, chills, night sweats, changes in weight, changes in appetite HEENT: Denies headaches, ear pain, changes in vision, rhinorrhea, sore throat CV: Denies CP, palpitations, SOB, orthopnea Pulm: Denies SOB, cough, wheezing GI: Denies abdominal pain, nausea, vomiting, diarrhea, constipation GU: Denies dysuria, hematuria, frequency, vaginal discharge  +vaginal irritation Msk: Denies muscle cramps, joint pains Neuro: Denies weakness, numbness, tingling Skin: Denies rashes, bruising Psych: Denies depression, anxiety, hallucinations     Objective:    Blood pressure 118/80, pulse 90, temperature 98.5 F (36.9 C), temperature source Oral, height 5\' 2"  (1.575 m), weight 201 lb (91.2 kg), SpO2 98 %.   Gen. Pleasant, well-nourished, in no distress, normal affect   HEENT: [place]/AT, face symmetric, no scleral icterus, PERRLA, nares patent without drainage Lungs: no accessory muscle use, CTAB, no wheezes or rales Cardiovascular: RRR, no m/r/g, no peripheral edema Neuro:  A&Ox3, CN  II-XII intact, normal gait Skin:  Warm, no lesions/ rash   Wt Readings from Last 3 Encounters:  11/17/17 201 lb (91.2 kg)  11/02/17 207 lb 3.7 oz (94 kg)    Lab Results  Component Value Date   WBC 6.9 11/03/2017   HGB 10.8 (L) 11/03/2017   HCT 33.0 (L) 11/03/2017   PLT 183 11/03/2017   GLUCOSE 125 (H) 11/03/2017   ALT 21 11/01/2017   AST 22 11/01/2017   NA 137 11/03/2017   K 3.7 11/03/2017   CL 106 11/03/2017   CREATININE 0.69 11/03/2017   BUN <5 (L) 11/03/2017   CO2 24 11/03/2017   INR 1.13 10/31/2017   HGBA1C 5.8 (H) 11/02/2017    Assessment/Plan:  Vaginal itching  - Plan: POCT Urinalysis Dipstick (Automated) negative  Yeast infection  -likely 2/2 recent abx use. - Plan: fluconazole (DIFLUCAN) 150 MG tablet  Grier Mitts, MD

## 2018-06-29 ENCOUNTER — Other Ambulatory Visit: Payer: Self-pay | Admitting: Obstetrics

## 2018-06-29 DIAGNOSIS — Z780 Asymptomatic menopausal state: Secondary | ICD-10-CM

## 2018-06-29 DIAGNOSIS — E2839 Other primary ovarian failure: Secondary | ICD-10-CM

## 2018-07-03 DIAGNOSIS — C50911 Malignant neoplasm of unspecified site of right female breast: Secondary | ICD-10-CM | POA: Insufficient documentation

## 2018-07-03 DIAGNOSIS — K219 Gastro-esophageal reflux disease without esophagitis: Secondary | ICD-10-CM | POA: Insufficient documentation

## 2018-07-09 ENCOUNTER — Other Ambulatory Visit: Payer: Self-pay | Admitting: Obstetrics

## 2018-07-09 DIAGNOSIS — N63 Unspecified lump in unspecified breast: Secondary | ICD-10-CM

## 2018-07-13 ENCOUNTER — Ambulatory Visit
Admission: RE | Admit: 2018-07-13 | Discharge: 2018-07-13 | Disposition: A | Discharge status: Home / Self Care | Payer: BC Managed Care – PPO | Source: Ambulatory Visit | Attending: Obstetrics | Admitting: Obstetrics

## 2018-07-13 ENCOUNTER — Other Ambulatory Visit: Payer: Self-pay | Admitting: Obstetrics

## 2018-07-13 DIAGNOSIS — N63 Unspecified lump in unspecified breast: Secondary | ICD-10-CM

## 2018-07-13 DIAGNOSIS — N6320 Unspecified lump in the left breast, unspecified quadrant: Secondary | ICD-10-CM

## 2018-07-24 ENCOUNTER — Other Ambulatory Visit: Payer: Self-pay

## 2018-07-24 ENCOUNTER — Ambulatory Visit
Admission: RE | Admit: 2018-07-24 | Discharge: 2018-07-24 | Disposition: A | Discharge status: Home / Self Care | Payer: Medicare Other | Source: Ambulatory Visit | Attending: Obstetrics | Admitting: Obstetrics

## 2018-07-24 DIAGNOSIS — N6320 Unspecified lump in the left breast, unspecified quadrant: Secondary | ICD-10-CM

## 2018-09-03 ENCOUNTER — Other Ambulatory Visit: Payer: BC Managed Care – PPO

## 2018-10-05 ENCOUNTER — Other Ambulatory Visit: Payer: Medicare Other

## 2018-11-28 ENCOUNTER — Ambulatory Visit
Admission: RE | Admit: 2018-11-28 | Discharge: 2018-11-28 | Disposition: A | Discharge status: Home / Self Care | Payer: Medicare Other | Source: Ambulatory Visit | Attending: Obstetrics | Admitting: Obstetrics

## 2018-11-28 ENCOUNTER — Other Ambulatory Visit: Payer: Self-pay

## 2018-11-28 DIAGNOSIS — E2839 Other primary ovarian failure: Secondary | ICD-10-CM

## 2018-11-28 DIAGNOSIS — Z780 Asymptomatic menopausal state: Secondary | ICD-10-CM

## 2019-03-18 ENCOUNTER — Other Ambulatory Visit: Payer: Self-pay

## 2019-03-18 DIAGNOSIS — Z20822 Contact with and (suspected) exposure to covid-19: Secondary | ICD-10-CM

## 2019-03-19 LAB — NOVEL CORONAVIRUS, NAA: SARS-CoV-2, NAA: DETECTED — AB

## 2019-04-09 ENCOUNTER — Other Ambulatory Visit: Payer: Self-pay

## 2019-04-09 DIAGNOSIS — Z20822 Contact with and (suspected) exposure to covid-19: Secondary | ICD-10-CM

## 2019-04-11 LAB — NOVEL CORONAVIRUS, NAA: SARS-CoV-2, NAA: NOT DETECTED

## 2019-06-04 ENCOUNTER — Ambulatory Visit: Payer: Medicare Other

## 2019-06-13 ENCOUNTER — Ambulatory Visit: Payer: Medicare PPO | Attending: Internal Medicine

## 2019-06-13 DIAGNOSIS — Z23 Encounter for immunization: Secondary | ICD-10-CM

## 2019-06-13 NOTE — Progress Notes (Signed)
Covid-19 Vaccination Clinic  Name:  Shaida Kittelson    MRN: [id number] DOB: [birth year]  06/13/2019  Ms. Dougan was observed post Covid-19 immunization for 15 minutes without incidence. She was provided with Vaccine Information Sheet and instruction to access the V-Safe system.   Ms. Garciagarcia was instructed to call 911 with any severe reactions post vaccine: Marland Kitchen Difficulty breathing  . Swelling of your face and throat  . A fast heartbeat  . A bad rash all over your body  . Dizziness and weakness    Immunizations Administered    Name Date Dose VIS Date Route   Pfizer COVID-19 Vaccine 06/13/2019  2:51 PM 0.3 mL 04/19/2019 Intramuscular   Manufacturer: [employer], [employer]   Lot: [id number]   NDC: [address]-4

## 2019-06-25 ENCOUNTER — Ambulatory Visit: Payer: Medicare Other

## 2019-07-08 ENCOUNTER — Ambulatory Visit: Payer: Medicare PPO | Attending: Internal Medicine

## 2019-07-08 DIAGNOSIS — Z23 Encounter for immunization: Secondary | ICD-10-CM

## 2019-07-08 NOTE — Progress Notes (Signed)
Covid-19 Vaccination Clinic  Name:  Alicia Delgado    MRN: [id number] DOB: [birth year]  07/08/2019  Alicia Delgado was observed post Covid-19 immunization for 15 minutes without incidence. She was provided with Vaccine Information Sheet and instruction to access the V-Safe system.   Alicia Delgado was instructed to call 911 with any severe reactions post vaccine: Marland Kitchen Difficulty breathing  . Swelling of your face and throat  . A fast heartbeat  . A bad rash all over your body  . Dizziness and weakness    Immunizations Administered    Name Date Dose VIS Date Route   Pfizer COVID-19 Vaccine 07/08/2019  3:50 PM 0.3 mL 04/19/2019 Intramuscular   Manufacturer: [employer], [employer]   Lot: [id number]   NDC: [address]-4

## 2019-07-10 IMAGING — CT CT HEAD W/O CM
4 series · 16 of 47 positions shown, 18 images · non-contrast
Comparison: None.

CLINICAL DATA: Posterior headache for the past 6 days.

EXAM:
CT HEAD WITHOUT CONTRAST
TECHNIQUE: Contiguous axial images were obtained from the base of the skull
through the vertex without intravenous contrast.

[Series 3: head without · axial · non-contrast · 0.42mm/px · z∈[-104,+16]mm · 7 of 33 slices shown, 9 images]
[im 5/33  brain]
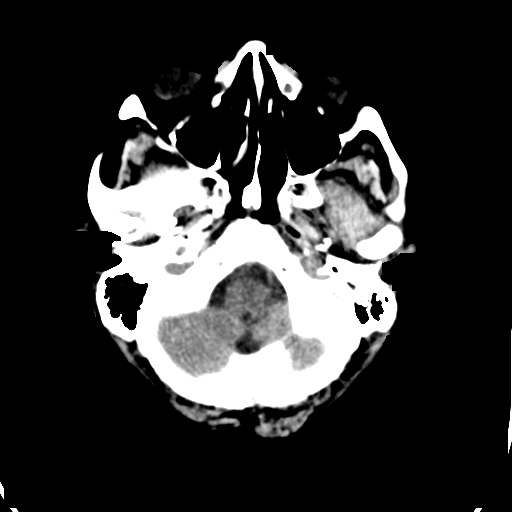
[im 5/33  bone]
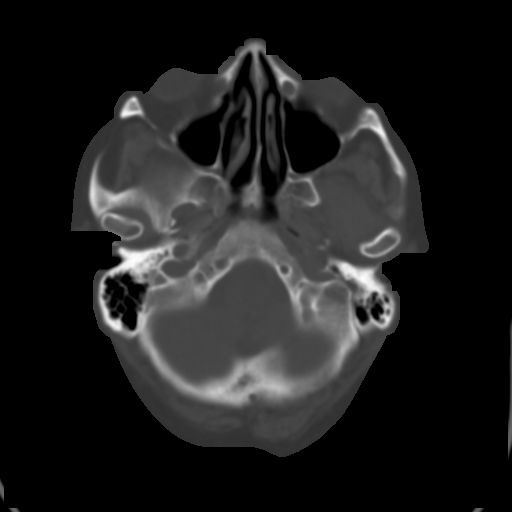
[im 9/33  brain]
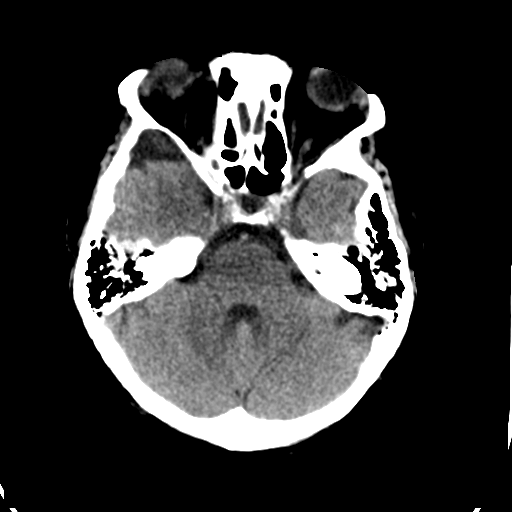
[im 13/33  brain]
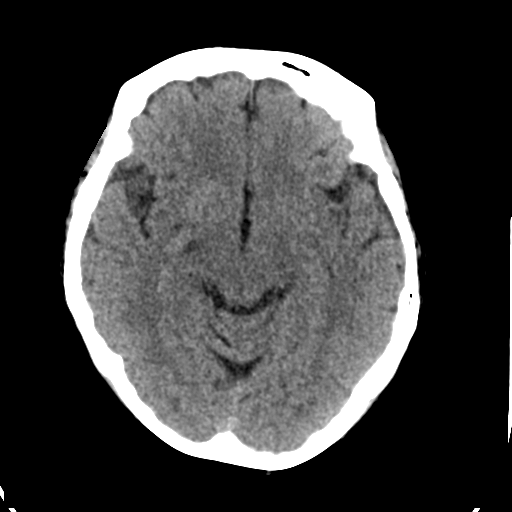
[im 17/33  brain]
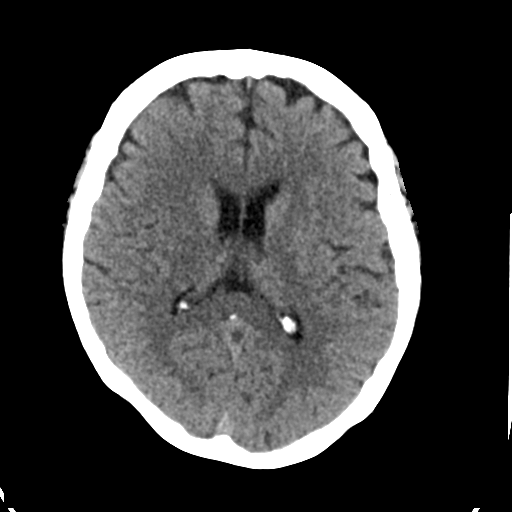
[im 21/33  brain]
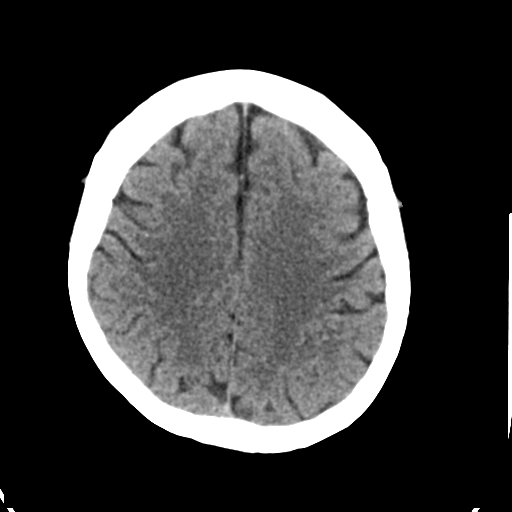
[im 21/33  bone]
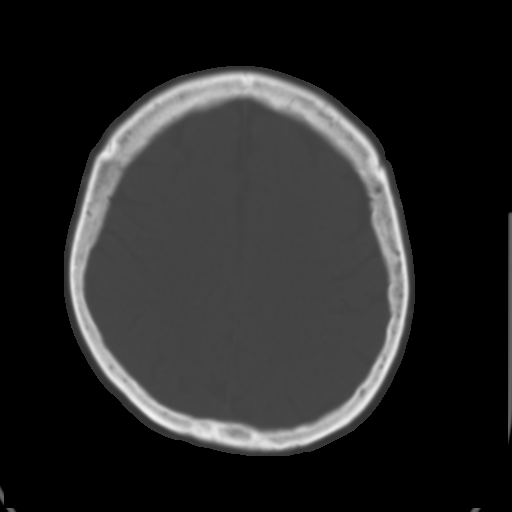
[im 25/33  brain]
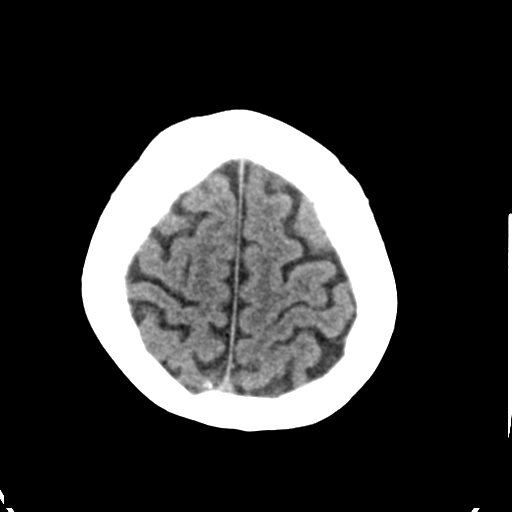
[im 29/33  brain]
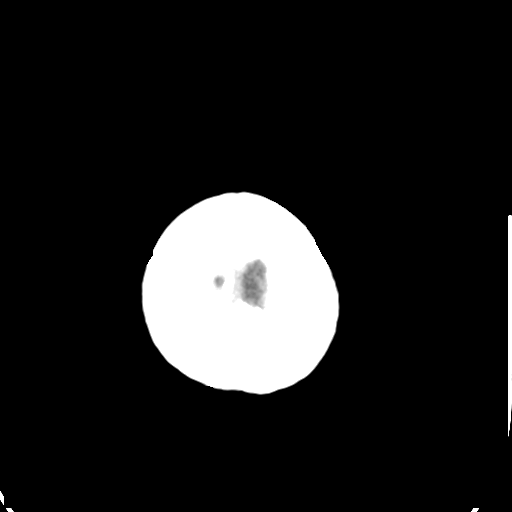

[Series 4: head bone · axial · 0.42mm/px · z∈[-108,-76]mm · 3 of 82 slices shown]
[im 9/82  bone]
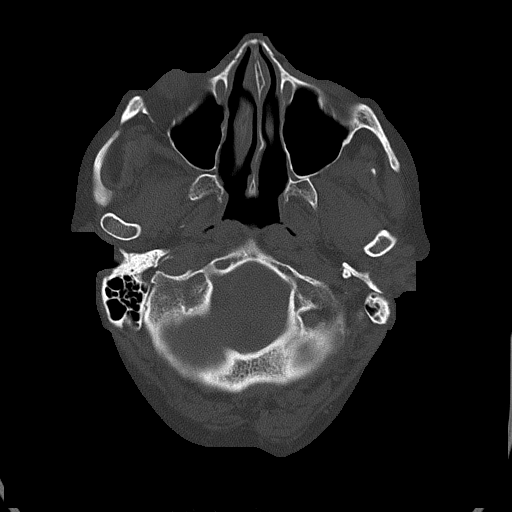
[im 17/82  bone]
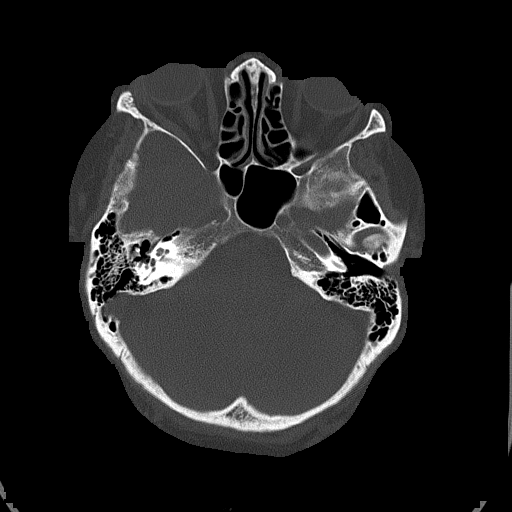
[im 25/82  bone]
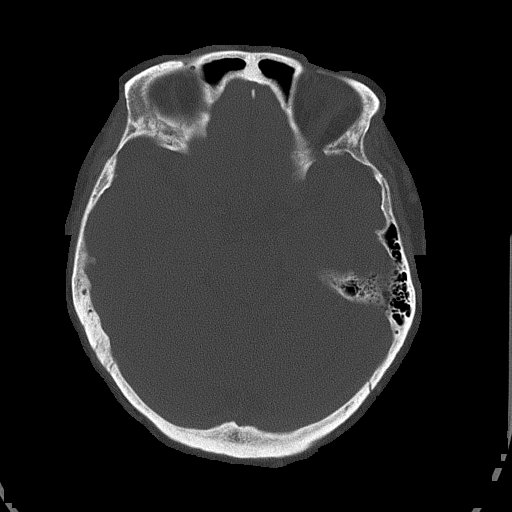

[Series 5: head without cor · coronal · non-contrast · 0.29mm/px · 3 of 68 slices shown]
[im 23/68  brain]
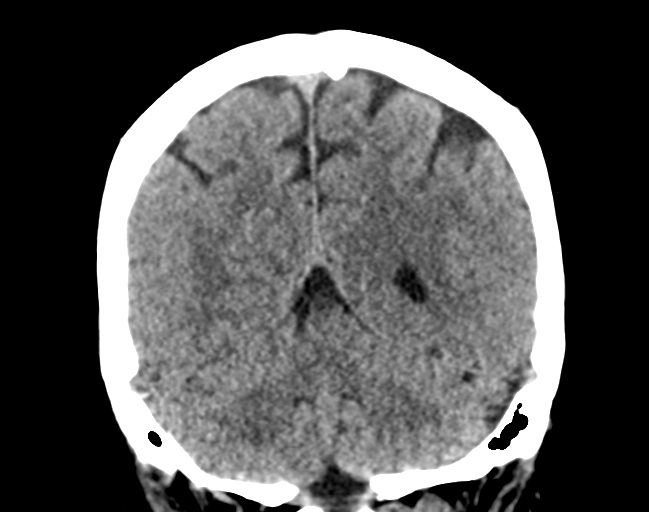
[im 30/68  brain]
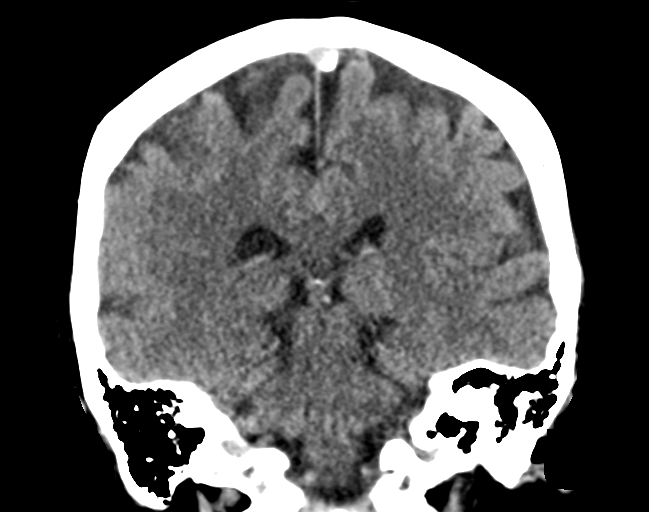
[im 38/68  brain]
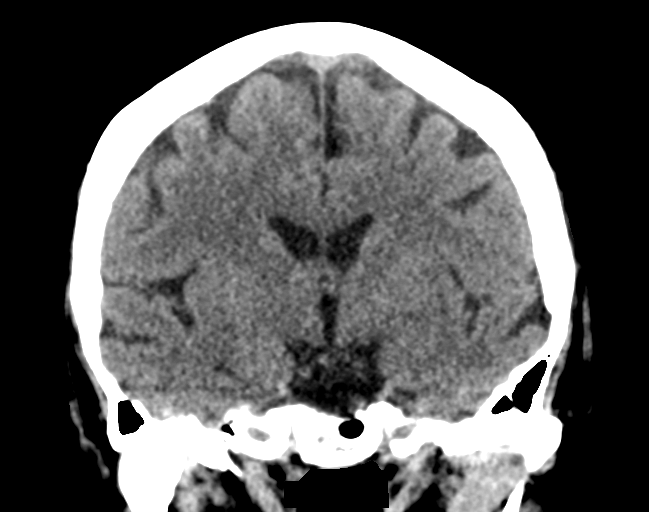

[Series 6: head without sag · sagittal · non-contrast · 0.29mm/px · 3 of 59 slices shown]
[im 20/59  brain]
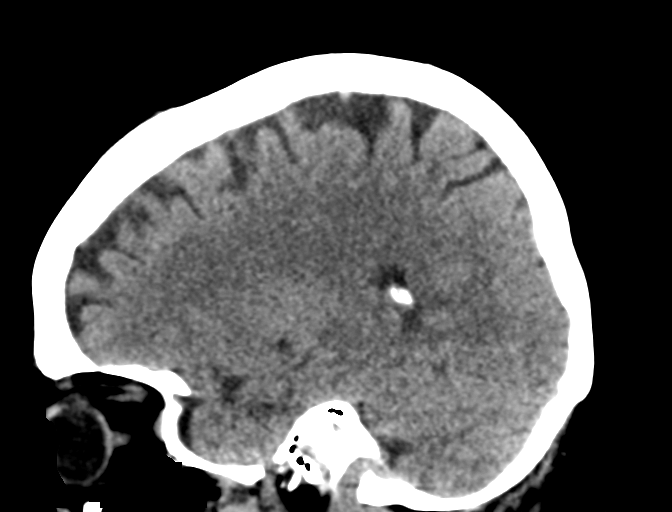
[im 30/59  brain]
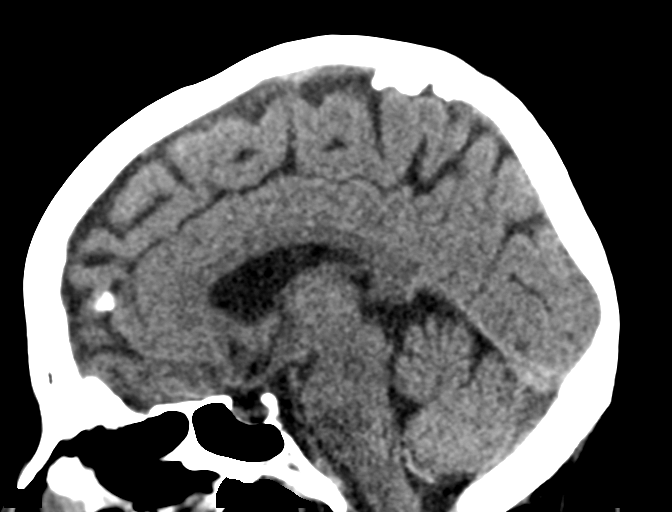
[im 39/59  brain]
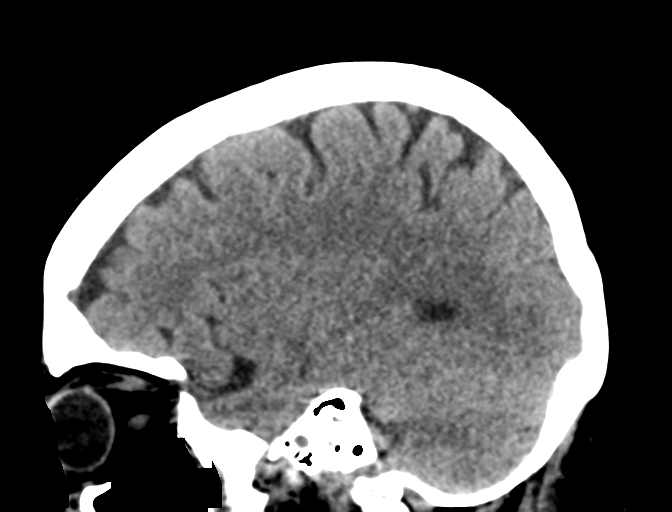

[16 of 47 positions shown; findings below may reference images not displayed]

FINDINGS: Brain: No evidence of acute infarction, hemorrhage, hydrocephalus,
extra-axial collection or mass lesion/mass effect. Old lacunar
infarcts in the right basal ganglia. Small CSF density lesion in the
right middle cranial fossa, most consistent with an arachnoid cyst.
Mild age related cerebral atrophy.

Vascular: No hyperdense vessel or unexpected calcification.

Skull: Normal. Negative for fracture or focal lesion.

Sinuses/Orbits: No acute finding.

Other: None.
IMPRESSION: 1.  No acute intracranial abnormality.

## 2020-10-22 ENCOUNTER — Other Ambulatory Visit: Payer: Self-pay | Admitting: Obstetrics

## 2020-10-22 DIAGNOSIS — R928 Other abnormal and inconclusive findings on diagnostic imaging of breast: Secondary | ICD-10-CM

## 2020-10-27 ENCOUNTER — Other Ambulatory Visit: Payer: Self-pay | Admitting: Obstetrics

## 2020-10-27 ENCOUNTER — Other Ambulatory Visit: Payer: Self-pay

## 2020-10-27 ENCOUNTER — Ambulatory Visit
Admission: RE | Admit: 2020-10-27 | Discharge: 2020-10-27 | Disposition: A | Discharge status: Home / Self Care | Payer: Medicare PPO | Source: Ambulatory Visit | Attending: Obstetrics | Admitting: Obstetrics

## 2020-10-27 DIAGNOSIS — R928 Other abnormal and inconclusive findings on diagnostic imaging of breast: Secondary | ICD-10-CM

## 2020-10-27 DIAGNOSIS — N6320 Unspecified lump in the left breast, unspecified quadrant: Secondary | ICD-10-CM

## 2021-04-29 ENCOUNTER — Ambulatory Visit
Admission: RE | Admit: 2021-04-29 | Discharge: 2021-04-29 | Disposition: A | Discharge status: Home / Self Care | Payer: Medicare PPO | Source: Ambulatory Visit | Attending: Obstetrics | Admitting: Obstetrics

## 2021-04-29 ENCOUNTER — Other Ambulatory Visit: Payer: Self-pay | Admitting: Obstetrics

## 2021-04-29 DIAGNOSIS — N6320 Unspecified lump in the left breast, unspecified quadrant: Secondary | ICD-10-CM

## 2021-11-01 ENCOUNTER — Other Ambulatory Visit: Payer: Self-pay | Admitting: Obstetrics

## 2021-11-01 ENCOUNTER — Ambulatory Visit
Admission: RE | Admit: 2021-11-01 | Discharge: 2021-11-01 | Disposition: A | Discharge status: Home / Self Care | Payer: Medicare PPO | Source: Ambulatory Visit | Attending: Obstetrics | Admitting: Obstetrics

## 2021-11-01 DIAGNOSIS — N6320 Unspecified lump in the left breast, unspecified quadrant: Secondary | ICD-10-CM

## 2021-11-01 HISTORY — DX: Malignant neoplasm of unspecified site of unspecified female breast: C50.919

## 2021-11-02 ENCOUNTER — Other Ambulatory Visit: Payer: Self-pay | Admitting: Obstetrics

## 2021-11-02 DIAGNOSIS — Z13820 Encounter for screening for osteoporosis: Secondary | ICD-10-CM

## 2022-04-22 ENCOUNTER — Ambulatory Visit
Admission: RE | Admit: 2022-04-22 | Discharge: 2022-04-22 | Disposition: A | Discharge status: Home / Self Care | Payer: Medicare PPO | Source: Ambulatory Visit | Attending: Obstetrics | Admitting: Obstetrics

## 2022-04-22 DIAGNOSIS — Z13820 Encounter for screening for osteoporosis: Secondary | ICD-10-CM

## 2022-06-20 DIAGNOSIS — M25561 Pain in right knee: Secondary | ICD-10-CM | POA: Insufficient documentation

## 2022-07-04 DIAGNOSIS — F1520 Other stimulant dependence, uncomplicated: Secondary | ICD-10-CM | POA: Insufficient documentation

## 2022-07-04 DIAGNOSIS — R21 Rash and other nonspecific skin eruption: Secondary | ICD-10-CM | POA: Insufficient documentation

## 2022-07-04 DIAGNOSIS — Z853 Personal history of malignant neoplasm of breast: Secondary | ICD-10-CM | POA: Insufficient documentation

## 2022-09-12 ENCOUNTER — Other Ambulatory Visit: Payer: Self-pay | Admitting: Obstetrics

## 2022-09-12 DIAGNOSIS — N63 Unspecified lump in unspecified breast: Secondary | ICD-10-CM

## 2022-10-10 DIAGNOSIS — R252 Cramp and spasm: Secondary | ICD-10-CM | POA: Insufficient documentation

## 2022-10-24 ENCOUNTER — Other Ambulatory Visit: Payer: Self-pay | Admitting: Otolaryngology

## 2022-10-24 DIAGNOSIS — H918X3 Other specified hearing loss, bilateral: Secondary | ICD-10-CM

## 2022-11-03 ENCOUNTER — Ambulatory Visit
Admission: RE | Admit: 2022-11-03 | Discharge: 2022-11-03 | Disposition: A | Discharge status: Home / Self Care | Payer: Medicare PPO | Source: Ambulatory Visit | Attending: Obstetrics | Admitting: Obstetrics

## 2022-11-03 ENCOUNTER — Ambulatory Visit
Admission: RE | Admit: 2022-11-03 | Discharge: 2022-11-03 | Disposition: A | Discharge status: Home / Self Care | Payer: Medicare HMO | Source: Ambulatory Visit | Attending: Obstetrics | Admitting: Obstetrics

## 2022-11-03 DIAGNOSIS — N63 Unspecified lump in unspecified breast: Secondary | ICD-10-CM

## 2022-11-15 ENCOUNTER — Ambulatory Visit
Admission: RE | Admit: 2022-11-15 | Discharge: 2022-11-15 | Disposition: A | Discharge status: Home / Self Care | Payer: Medicare HMO | Source: Ambulatory Visit | Attending: Otolaryngology | Admitting: Otolaryngology

## 2022-11-15 DIAGNOSIS — H918X3 Other specified hearing loss, bilateral: Secondary | ICD-10-CM

## 2022-11-15 MED ORDER — GADOPICLENOL 0.5 MMOL/ML IV SOLN
9.0000 mL | Freq: Once | INTRAVENOUS | Status: AC | PRN
  Administered 2022-11-15: 9 mL via INTRAVENOUS

## 2022-11-28 ENCOUNTER — Other Ambulatory Visit: Payer: Self-pay | Admitting: Otolaryngology

## 2022-11-28 DIAGNOSIS — H93A9 Pulsatile tinnitus, unspecified ear: Secondary | ICD-10-CM

## 2022-11-29 ENCOUNTER — Ambulatory Visit (INDEPENDENT_AMBULATORY_CARE_PROVIDER_SITE_OTHER): Payer: Medicare HMO | Admitting: Dermatology

## 2022-11-29 ENCOUNTER — Encounter: Payer: Self-pay | Admitting: Dermatology

## 2022-11-29 VITALS — BP 100/67

## 2022-11-29 DIAGNOSIS — L739 Follicular disorder, unspecified: Secondary | ICD-10-CM | POA: Diagnosis not present

## 2022-11-29 MED ORDER — CLINDAMYCIN PHOSPHATE 1 % EX LOTN: TOPICAL_LOTION | Freq: Every day | CUTANEOUS | 2 refills | Status: DC

## 2022-11-29 MED ORDER — ADAPALENE 0.3 % EX GEL: 1.0000 | Freq: Every evening | CUTANEOUS | 2 refills | Status: DC

## 2022-11-29 NOTE — Progress Notes (Signed)
   New Patient Visit   Subjective  Alicia Delgado is a 72 y.o. female who presents for the following: Bumps on the left side of the face and under her nose over the past year lasting about 1 month. They will flatten on their own. Sometimes they are tender. Not itchy. She is using Theme park manager every other day. She is using Loreal Revita-lift for skin care.   The following portions of the chart were reviewed this encounter and updated as appropriate: medications, allergies, medical history  Review of Systems:  No other skin or systemic complaints except as noted in HPI or Assessment and Plan.  Objective  Well appearing patient in no apparent distress; mood and affect are within normal limits.           A focused examination was performed of the following areas: Face  Relevant exam findings are noted in the Assessment and Plan.    Assessment & Plan   1. Folliculitis - Plan: Initiate treatment with topical clindamycin, applying it every morning after washing the face to reduce bacterial count and prevent new flares. Introduce adapalene cream to be used three nights a week (Monday, Wednesday, Friday) to unclog pores and prevent the formation of bumps, while monitoring for potential dryness. Continue the regimen of Garnier micellar water as a pre-cleanser, followed by a gentle face wash, with samples to be provided.  -Maintain use of L'Oreal Revitalift as a moisturizer. Schedule a follow-up appointment in three months to evaluate the treatment's effectiveness and condition resolution.  Consider the addition of oral medication or a stronger cream if the condition has not resolved.  Return in about 3 months (around 03/01/2023) for Folliculitis .  Jaclynn Guarneri, CMA, am acting as scribe for [employer], DO.   Documentation: I have reviewed the above documentation for accuracy and completeness, and I agree with the above.  Langston Reusing, DO

## 2022-11-29 NOTE — Patient Instructions (Addendum)
Thank you for visiting our clinic today. We appreciate your commitment to improving your health and are glad to assist you in managing your skin condition.  Based on our consultation, here are the key instructions and recommendations for your treatment plan:  - Medications Prescribed for your Folliculitis:   - Clindamycin topical lotion: Apply every morning after washing your face to reduce bacteria and prevent new flare-ups.   - Adapalene cream: Use on the entire face only on Monday, Wednesday, and Friday nights to help unclog pores and prevent bumps. This can be drying, so it is limited to three times a week.  - Skincare Routine:   - Continue using Micellar water and Garnier products as part of your cleansing routine.   - A new face wash sample will be provided for use after the Micellar water.   - Continue using L'Oreal Revitalift as your moisturizer.  - Follow-up Appointment:   - Please return for a follow-up in three months to assess the effectiveness of the treatment. If the condition has not improved sufficiently, we may consider additional treatments.  - Pharmacy Instructions:   - Prescriptions have been sent to your pharmacy and are generic to ensure affordability. If there are any issues with obtaining your medications, please have the pharmacy contact us directly.  We hope these steps will significantly improve your condition. If you have any questions or concerns about the treatment plan, feel free to reach out.   Additional Tips:  Sun Protection: During the daytime, it is essential to apply a broad-spectrum sunscreen with an SPF of 30 or higher to protect your skin from harmful UV rays. Apply sunscreen as the last step of your morning skincare routine and reapply throughout the day as needed, especially if you will be spending time outdoors.  Hydration: Drink plenty of water throughout the day to keep your skin hydrated from within.  Healthy Lifestyle: Maintain a balanced  diet, get regular exercise, manage stress, and prioritize adequate sleep to support overall skin health.   Following a consistent daily skincare regimen can help promote healthy, radiant skin and minimize the risk of common skin issues. Be patient and consistent with your routine, and remember to listen to your skin's needs     Due to recent changes in healthcare laws, you may see results of your pathology and/or laboratory studies on MyChart before the doctors have had a chance to review them. We understand that in some cases there may be results that are confusing or concerning to you. Please understand that not all results are received at the same time and often the doctors may need to interpret multiple results in order to provide you with the best plan of care or course of treatment. Therefore, we ask that you please give [place] 2 business days to thoroughly review all your results before contacting the office for clarification. Should we see a critical lab result, you will be contacted sooner.   If You Need Anything After Your Visit  If you have any questions or concerns for your doctor, please call our main line at [phone] If no one answers, please leave a voicemail as directed and we will return your call as soon as possible. Messages left after 4 pm will be answered the following business day.   You may also send [place] a message via MyChart. We typically respond to MyChart messages within 1-2 business days.  For prescription refills, please ask your pharmacy to contact our office. Our fax number  is [phone].  If you have an urgent issue when the clinic is closed that cannot wait until the next business day, you can page your doctor at the number below.    Please note that while we do our best to be available for urgent issues outside of office hours, we are not available 24/7.   If you have an urgent issue and are unable to reach [place], you may choose to seek medical care at your  doctor's office, retail clinic, urgent care center, or emergency room.  If you have a medical emergency, please immediately call 911 or go to the emergency department. In the event of inclement weather, please call our main line at [phone] for an update on the status of any delays or closures.  Dermatology Medication Tips: Please keep the boxes that topical medications come in in order to help keep track of the instructions about where and how to use these. Pharmacies typically print the medication instructions only on the boxes and not directly on the medication tubes.   If your medication is too expensive, please contact our office at [phone] or send [place] a message through MyChart.   We are unable to tell what your co-pay for medications will be in advance as this is different depending on your insurance coverage. However, we may be able to find a substitute medication at lower cost or fill out paperwork to get insurance to cover a needed medication.   If a prior authorization is required to get your medication covered by your insurance company, please allow [place] 1-2 business days to complete this process.  Drug prices often vary depending on where the prescription is filled and some pharmacies may offer cheaper prices.  The website www.goodrx.com contains coupons for medications through different pharmacies. The prices here do not account for what the cost may be with help from insurance (it may be cheaper with your insurance), but the website can give you the price if you did not use any insurance.  - You can print the associated coupon and take it with your prescription to the pharmacy.  - You may also stop by our office during regular business hours and pick up a GoodRx coupon card.  - If you need your prescription sent electronically to a different pharmacy, notify our office through [facility] or by phone at [phone]

## 2022-12-19 ENCOUNTER — Ambulatory Visit
Admission: RE | Admit: 2022-12-19 | Discharge: 2022-12-19 | Disposition: A | Discharge status: Home / Self Care | Payer: Medicare HMO | Source: Ambulatory Visit | Attending: Otolaryngology | Admitting: Otolaryngology

## 2022-12-19 DIAGNOSIS — H93A9 Pulsatile tinnitus, unspecified ear: Secondary | ICD-10-CM

## 2022-12-19 MED ORDER — IOPAMIDOL (ISOVUE-370) INJECTION 76%
100.0000 mL | Freq: Once | INTRAVENOUS | Status: AC | PRN
  Administered 2022-12-19: 100 mL via INTRAVENOUS

## 2023-01-10 DIAGNOSIS — L299 Pruritus, unspecified: Secondary | ICD-10-CM | POA: Insufficient documentation

## 2023-03-01 ENCOUNTER — Encounter: Payer: Self-pay | Admitting: Dermatology

## 2023-03-01 ENCOUNTER — Ambulatory Visit: Payer: Medicare HMO | Admitting: Dermatology

## 2023-03-01 VITALS — BP 113/74 | HR 86

## 2023-03-01 DIAGNOSIS — B079 Viral wart, unspecified: Secondary | ICD-10-CM

## 2023-03-01 DIAGNOSIS — L700 Acne vulgaris: Secondary | ICD-10-CM | POA: Diagnosis not present

## 2023-03-01 DIAGNOSIS — M5416 Radiculopathy, lumbar region: Secondary | ICD-10-CM | POA: Insufficient documentation

## 2023-03-01 DIAGNOSIS — B078 Other viral warts: Secondary | ICD-10-CM

## 2023-03-01 DIAGNOSIS — M5126 Other intervertebral disc displacement, lumbar region: Secondary | ICD-10-CM | POA: Insufficient documentation

## 2023-03-01 MED ORDER — IMIQUIMOD 5 % EX CREA: TOPICAL_CREAM | Freq: Every day | CUTANEOUS | 2 refills | Status: AC

## 2023-03-01 NOTE — Patient Instructions (Signed)
Hello Ms. Deavers,  Thank you for visiting [place] today. We appreciate your commitment to improving your skin health. Here is a summary of the key instructions from today's consultation:  - Increase Adapalene Application: Use Adapalene cream every night to better control skin flare-ups.  - Morning Routine:   - Cleansing: Wash face with CeraVe containing benzoyl peroxide.   - Treatment and Protection: Apply Clindamycin and then CeraVe with sunscreen.  - Night Routine:   - Cleansing: Wash face.   - Treatment: Apply a pea-sized amount of Adapalene to the entire face and spot treat under the nose.   - Moisturizing: Use either CeraVe or La Roche-Posay moisturizer (samples provided).  - For Inflamed Cysts: Consider using Neutrogena acne patches at night to help reduce size.  - For White Spots: Imiquimod cream prescribed, to be applied at night. Use a small amount and expect results in about 3 months.  - Follow-Up: Schedule a return visit in 3 months to assess progress.  Please ensure to follow these routines consistently and reach out if you have any questions or need further assistance.  Best regards,  Dr. Langston Reusing Dermatology    Important Information  Due to recent changes in healthcare laws, you may see results of your pathology and/or laboratory studies on MyChart before the doctors have had a chance to review them. We understand that in some cases there may be results that are confusing or concerning to you. Please understand that not all results are received at the same time and often the doctors may need to interpret multiple results in order to provide you with the best plan of care or course of treatment. Therefore, we ask that you please give [place] 2 business days to thoroughly review all your results before contacting the office for clarification. Should we see a critical lab result, you will be contacted sooner.   If You Need Anything After Your Visit  If you have any  questions or concerns for your doctor, please call our main line at [phone] If no one answers, please leave a voicemail as directed and we will return your call as soon as possible. Messages left after 4 pm will be answered the following business day.   You may also send [place] a message via MyChart. We typically respond to MyChart messages within 1-2 business days.  For prescription refills, please ask your pharmacy to contact our office. Our fax number is [phone].  If you have an urgent issue when the clinic is closed that cannot wait until the next business day, you can page your doctor at the number below.    Please note that while we do our best to be available for urgent issues outside of office hours, we are not available 24/7.   If you have an urgent issue and are unable to reach [place], you may choose to seek medical care at your doctor's office, retail clinic, urgent care center, or emergency room.  If you have a medical emergency, please immediately call 911 or go to the emergency department. In the event of inclement weather, please call our main line at [phone] for an update on the status of any delays or closures.  Dermatology Medication Tips: Please keep the boxes that topical medications come in in order to help keep track of the instructions about where and how to use these. Pharmacies typically print the medication instructions only on the boxes and not directly on the medication tubes.   If your medication is  too expensive, please contact our office at [phone] or send [place] a message through MyChart.   We are unable to tell what your co-pay for medications will be in advance as this is different depending on your insurance coverage. However, we may be able to find a substitute medication at lower cost or fill out paperwork to get insurance to cover a needed medication.   If a prior authorization is required to get your medication covered by your insurance company,  please allow [place] 1-2 business days to complete this process.  Drug prices often vary depending on where the prescription is filled and some pharmacies may offer cheaper prices.  The website www.goodrx.com contains coupons for medications through different pharmacies. The prices here do not account for what the cost may be with help from insurance (it may be cheaper with your insurance), but the website can give you the price if you did not use any insurance.  - You can print the associated coupon and take it with your prescription to the pharmacy.  - You may also stop by our office during regular business hours and pick up a GoodRx coupon card.  - If you need your prescription sent electronically to a different pharmacy, notify our office through [facility] or by phone at [phone]

## 2023-03-01 NOTE — Progress Notes (Signed)
   Follow-Up Visit   Subjective  Alicia Delgado is a 72 y.o. female who presents for the following: Folliculitis  Patient present today for follow up visit for Folliculitis. Patient was last evaluated on 11/29/22.At her previous visit, she was instructed to initiate treatment with topical clindamycin lotion, applying it every morning after washing the face with Cerave to reduce bacterial count and prevent new flares. She was also instructed to apply adapalene cream three nights a week (Monday, Wednesday, Friday) to unclog pores and prevent the formation of bumps, while monitoring for potential dryness. She was instructed to continue the regimen of Garnier micellar water as a pre-cleanser( She reports she no longer uses), followed by a gentle face wash & to maintain use of L'Oreal Revitalift as a moisturizer. Patient reports sxs are  intermittent . Patient denies medication changes.  The following portions of the chart were reviewed this encounter and updated as appropriate: medications, allergies, medical history  Review of Systems:  No other skin or systemic complaints except as noted in HPI or Assessment and Plan.  Objective  Well appearing patient in no apparent distress; mood and affect are within normal limits.  A focused examination was performed of the following areas: Face  Relevant exam findings are noted in the Assessment and Plan.               Assessment & Plan   1. Acne Vulgaris - Assessment: Current regimen includes benzoyl peroxide wash, Adapalene, and Clindamycin. Considering the use of acne patches for large spots. - Plan: Continue benzoyl peroxide wash in the morning. Increase Adapalene to every night for better control. Continue Clindamycin in the morning. Use CeraVe with sunscreen in the morning and regular CeraVe or La Roche-Posay moisturizer at night. Consider acne patches for large, bothersome spots (e.g., Neutrogena brand). Follow up in 3 months or sooner  if acne worsens.   2. White Spots (Flat Warts) - Assessment: Presence of flat warts. - Plan: Prescribe Imiquimod cream for nightly application on affected areas. Follow up in 3 months to assess improvement.   No follow-ups on file.    Documentation: I have reviewed the above documentation for accuracy and completeness, and I agree with the above.  Stasia Cavalier, am acting as scribe for Langston Reusing, DO.  Langston Reusing, DO

## 2023-05-31 ENCOUNTER — Ambulatory Visit: Payer: Medicare (Managed Care) | Admitting: Dermatology

## 2023-05-31 ENCOUNTER — Encounter: Payer: Self-pay | Admitting: Dermatology

## 2023-05-31 VITALS — BP 105/70

## 2023-05-31 DIAGNOSIS — L700 Acne vulgaris: Secondary | ICD-10-CM | POA: Diagnosis not present

## 2023-05-31 DIAGNOSIS — B078 Other viral warts: Secondary | ICD-10-CM

## 2023-05-31 NOTE — Progress Notes (Signed)
   Follow-Up Visit   Subjective  Alicia Delgado is a 73 y.o. female who presents for the following: Flat warts - Acne  Patient present today for follow up visit. Patient was last evaluated on 03/01/23. Patient reports sxs are better. She stated that she has been using benzoyl peroxide wash, Adapalene, and Clindamycin for acne and applying the imiquimod cream on the flat warts as instructed at last visit. Patient denies medication changes.  The following portions of the chart were reviewed this encounter and updated as appropriate: medications, allergies, medical history  Review of Systems:  No other skin or systemic complaints except as noted in HPI or Assessment and Plan.  Objective  Well appearing patient in no apparent distress; mood and affect are within normal limits.   A focused examination was performed of the following areas: face & lower arms   Relevant exam findings are noted in the Assessment and Plan.                Right Eyebrow Verrucous papules   Assessment & Plan   Flat Warts Exam: Presence of flat warts.  Treatment: - Plan: Continue applying Imiquimod cream nightly on affected areas.  - Recommend starting OTC Cimetidine to help with clearing flat warts.  ACNE VULGARIS --> IMPROVED Exam: Open comedones and inflammatory papules  Stable  Treatment Plan: -Continue benzoyl peroxide wash & clindamycin swabs in the morning. - Continue applying Adapalene to every nightat bedtime. - Continue CeraVe with sunscreen in the morning and regular CeraVe or La Roche-Posay moisturizer at night.    OTHER VIRAL WARTS Right Eyebrow Destruction of lesion - Right Eyebrow Complexity: simple   Destruction method: cryotherapy   Informed consent: discussed and consent obtained   Timeout:  patient name, date of birth, surgical site, and procedure verified Lesion destroyed using liquid nitrogen: Yes   Region frozen until ice ball extended beyond lesion: Yes    Outcome: patient tolerated procedure well with no complications   Post-procedure details: wound care instructions given    Return in about 3 months (around 08/29/2023) for acne & flat warts.    Documentation: I have reviewed the above documentation for accuracy and completeness, and I agree with the above.   I, Shirron Marcha Solders, CMA, am acting as scribe for [employer], DO.   Langston Reusing, DO

## 2023-05-31 NOTE — Patient Instructions (Addendum)
Hello Alicia Delgado,  Thank you for visiting [place] today. We appreciate your commitment to improving your health and are pleased to see the progress you've made. Here is a summary of the key instructions from today's consultation:  Morning Routine: Continue using benzoyl peroxide in the morning followed by clindamycin.  Night Routine: Use a regular cleanser at night followed by adapalene. Adjust the frequency based on skin tolerance, especially in dry weather.  Wart Treatment: Apply imiquimod at night to the warts on your arms. Continue this treatment for 3 more months.  Additional Medication: Consider using Tagamet daily for the next 3 months to potentially clear warts faster.  Cryotherapy: For the new wart near your eye, we will proceed with cryotherapy using liquid nitrogen. After treatment, apply Aquaphor daily to aid healing.  Prescription Refills: Ensure you have sufficient refills for imiquimod. We will send a new prescription with 3 refills if needed.  Follow-Up: See you in 8 months for an acne check-up and as needed for other concerns.  Please stay warm and take care. We look forward to seeing your continued progress.  Warm regards,  Dr. Langston Reusing, Dermatology      Cryotherapy Aftercare  Wash gently with soap and water everyday.   Apply Vaseline and Band-Aid daily until healed.    Important Information  Due to recent changes in healthcare laws, you may see results of your pathology and/or laboratory studies on MyChart before the doctors have had a chance to review them. We understand that in some cases there may be results that are confusing or concerning to you. Please understand that not all results are received at the same time and often the doctors may need to interpret multiple results in order to provide you with the best plan of care or course of treatment. Therefore, we ask that you please give [place] 2 business days to thoroughly review all your results before contacting  the office for clarification. Should we see a critical lab result, you will be contacted sooner.   If You Need Anything After Your Visit  If you have any questions or concerns for your doctor, please call our main line at [phone] If no one answers, please leave a voicemail as directed and we will return your call as soon as possible. Messages left after 4 pm will be answered the following business day.   You may also send [place] a message via MyChart. We typically respond to MyChart messages within 1-2 business days.  For prescription refills, please ask your pharmacy to contact our office. Our fax number is [phone].  If you have an urgent issue when the clinic is closed that cannot wait until the next business day, you can page your doctor at the number below.    Please note that while we do our best to be available for urgent issues outside of office hours, we are not available 24/7.   If you have an urgent issue and are unable to reach [place], you may choose to seek medical care at your doctor's office, retail clinic, urgent care center, or emergency room.  If you have a medical emergency, please immediately call 911 or go to the emergency department. In the event of inclement weather, please call our main line at [phone] for an update on the status of any delays or closures.  Dermatology Medication Tips: Please keep the boxes that topical medications come in in order to help keep track of the instructions about where and how to use these.  Pharmacies typically print the medication instructions only on the boxes and not directly on the medication tubes.   If your medication is too expensive, please contact our office at [phone] or send [place] a message through MyChart.   We are unable to tell what your co-pay for medications will be in advance as this is different depending on your insurance coverage. However, we may be able to find a substitute medication at lower cost or fill out  paperwork to get insurance to cover a needed medication.   If a prior authorization is required to get your medication covered by your insurance company, please allow [place] 1-2 business days to complete this process.  Drug prices often vary depending on where the prescription is filled and some pharmacies may offer cheaper prices.  The website www.goodrx.com contains coupons for medications through different pharmacies. The prices here do not account for what the cost may be with help from insurance (it may be cheaper with your insurance), but the website can give you the price if you did not use any insurance.  - You can print the associated coupon and take it with your prescription to the pharmacy.  - You may also stop by our office during regular business hours and pick up a GoodRx coupon card.  - If you need your prescription sent electronically to a different pharmacy, notify our office through [facility] or by phone at [phone]

## 2023-08-31 ENCOUNTER — Ambulatory Visit: Payer: Medicare (Managed Care) | Admitting: Dermatology

## 2023-08-31 ENCOUNTER — Other Ambulatory Visit: Payer: Self-pay | Admitting: Family

## 2023-08-31 VITALS — BP 126/87

## 2023-08-31 DIAGNOSIS — L219 Seborrheic dermatitis, unspecified: Secondary | ICD-10-CM | POA: Diagnosis not present

## 2023-08-31 DIAGNOSIS — B078 Other viral warts: Secondary | ICD-10-CM | POA: Diagnosis not present

## 2023-08-31 DIAGNOSIS — R5381 Other malaise: Secondary | ICD-10-CM

## 2023-08-31 DIAGNOSIS — E2839 Other primary ovarian failure: Secondary | ICD-10-CM

## 2023-08-31 DIAGNOSIS — L700 Acne vulgaris: Secondary | ICD-10-CM | POA: Diagnosis not present

## 2023-08-31 MED ORDER — KETOCONAZOLE 2 % EX CREA: 1.0000 | TOPICAL_CREAM | Freq: Two times a day (BID) | CUTANEOUS | 0 refills | Status: AC

## 2023-08-31 NOTE — Patient Instructions (Addendum)
 Hello Alicia Delgado,  Thank you for visiting today. Here is a summary of the key instructions:  - Medications:   - Continue using imiquimod  cream on flat warts on arms every night   - Apply clindamycin  to face every morning   - Use tretinoin on face 2-3 nights per week   - Spot treat persistent acne cyst on left cheek with LaRoche-Posay benzoyl peroxide every morning and night   - Apply ketoconazole  cream to affected areas on nose twice a day until clear for seborrheic dermatitis  - Skin Care:   - Continue using Cerave Face Wash   - Keep using current moisturizer   - Try Neutrogena Wrinkle Repair 2-3 nights a week for wrinkles  - Follow-up:   - Return in 2 months for:     - Full body skin cancer screening     - Reassessment of persistent acne cyst on left cheek     - Possible biopsy of left cheek spot if still present  Please reach out if you have any questions or concerns.  Best Regards,  Dr. Lou[facility] Roup [facility]      Important Information  Due to recent changes in healthcare laws, you may see results of your pathology and/or laboratory studies on MyChart before the doctors have had a chance to review them. We understand that in some cases there may be results that are confusing or concerning to you. Please understand that not all results are received at the same time and often the doctors may need to interpret multiple results in order to provide you with the best plan of care or course of treatment. Therefore, we ask that you please give us  2 business days to thoroughly review all your results before contacting the office for clarification. Should we see a critical lab result, you will be contacted sooner.   If You Need Anything After Your Visit  If you have any questions or concerns for your doctor, please call our main line at [phone] If no one answers, please leave a voicemail as directed and we will return your call as soon as possible. Messages left after 4 pm  will be answered the following business day.   You may also send us  a message via MyChart. We typically respond to MyChart messages within 1-2 business days.  For prescription refills, please ask your pharmacy to contact our office. Our fax number is [phone].  If you have an urgent issue when the clinic is closed that cannot wait until the next business day, you can page your doctor at the number below.    Please note that while we do our best to be available for urgent issues outside of office hours, we are not available 24/7.   If you have an urgent issue and are unable to reach us , you may choose to seek medical care at your doctor's office, retail clinic, urgent care center, or emergency room.  If you have a medical emergency, please immediately call 911 or go to the emergency department. In the event of inclement weather, please call our main line at [phone] for an update on the status of any delays or closures.  Dermatology Medication Tips: Please keep the boxes that topical medications come in in order to help keep track of the instructions about where and how to use these. Pharmacies typically print the medication instructions only on the boxes and not directly on the medication tubes.   If your medication is too expensive, please contact our  office at [phone] or send us  a message through MyChart.   We are unable to tell what your co-pay for medications will be in advance as this is different depending on your insurance coverage. However, we may be able to find a substitute medication at lower cost or fill out paperwork to get insurance to cover a needed medication.   If a prior authorization is required to get your medication covered by your insurance company, please allow us  1-2 business days to complete this process.  Drug prices often vary depending on where the prescription is filled and some pharmacies may offer cheaper prices.  The website www.goodrx.com  contains coupons for medications through different pharmacies. The prices here do not account for what the cost may be with help from insurance (it may be cheaper with your insurance), but the website can give you the price if you did not use any insurance.  - You can print the associated coupon and take it with your prescription to the pharmacy.  - You may also stop by our office during regular business hours and pick up a GoodRx coupon card.  - If you need your prescription sent electronically to a different pharmacy, notify our office through [facility] or by phone at [phone]

## 2023-08-31 NOTE — Progress Notes (Signed)
   Follow-Up Visit   Subjective  Alicia Delgado is a 73 y.o. female who presents for the following: acne & flat warts  Patient present today for follow up visit. Patient was last evaluated on 05/31/23. At this visit patient was prescribed Imiquimod , Adapalene  & Clindamycin . Recommended OTC Cimetidine. Patient reports sxs are better. Patient denies medication changes.  The following portions of the chart were reviewed this encounter and updated as appropriate: medications, allergies, medical history  Review of Systems:  No other skin or systemic complaints except as noted in HPI or Assessment and Plan.  Objective  Well appearing patient in no apparent distress; mood and affect are within normal limits.   A focused examination was performed of the following areas: face & lower arms   Relevant exam findings are noted in the Assessment and Plan.          Assessment & Plan   ACNE VULGARIS Exam: Open comedones and inflammatory papules  Not at goal  Treatment Plan: - Samples of the LRP spot treatment with BPO provided - Continue using Adapalene  & Clindamycin  lotion   Flat Warts Exam: Presence of flat warts that are improving   Treatment: - Continue applying Imiquimod  cream nightly on affected areas.  - Continue taking OTC Cimetidine to help with clearing flat warts.   SEBORRHEIC DERMATITIS Exam: Pink patches with greasy scale at crease of the nose  flared  Seborrheic Dermatitis is a chronic persistent rash characterized by pinkness and scaling most commonly of the mid face but also can occur on the scalp (dandruff), ears; mid chest, mid back and groin.  It tends to be exacerbated by stress and cooler weather.  People who have neurologic disease may experience new onset or exacerbation of existing seborrheic dermatitis.  The condition is not curable but treatable and can be controlled.  Treatment Plan: - Rx ketoconazole  cream to apply to affected areas of the nose  BID    Return in about 2 months (around 10/31/2023) for TBSE.    Documentation: I have reviewed the above documentation for accuracy and completeness, and I agree with the above.  I, Alejandrina Raimer Louanne Roussel, CMA, am acting as scribe for [employer], DO.   Louana Roup, DO

## 2023-09-05 ENCOUNTER — Encounter: Payer: Self-pay | Admitting: Dermatology

## 2023-09-19 ENCOUNTER — Other Ambulatory Visit: Payer: Self-pay

## 2023-09-19 ENCOUNTER — Encounter: Payer: Self-pay | Admitting: Dermatology

## 2023-09-19 MED ORDER — ADAPALENE 0.3 % EX GEL
1.0000 | Freq: Every evening | CUTANEOUS | 2 refills | Status: AC
Start: 2023-09-19 — End: ?

## 2023-09-19 MED ORDER — CLINDAMYCIN PHOSPHATE 1 % EX LOTN: TOPICAL_LOTION | Freq: Every day | CUTANEOUS | 2 refills | Status: AC

## 2023-10-16 ENCOUNTER — Other Ambulatory Visit: Payer: Self-pay | Admitting: Family

## 2023-10-16 DIAGNOSIS — Z1231 Encounter for screening mammogram for malignant neoplasm of breast: Secondary | ICD-10-CM

## 2023-11-01 ENCOUNTER — Encounter: Payer: Self-pay | Admitting: Dermatology

## 2023-11-01 ENCOUNTER — Ambulatory Visit: Payer: Medicare (Managed Care) | Admitting: Dermatology

## 2023-11-01 VITALS — BP 111/74 | HR 89

## 2023-11-01 DIAGNOSIS — L814 Other melanin hyperpigmentation: Secondary | ICD-10-CM | POA: Diagnosis not present

## 2023-11-01 DIAGNOSIS — D492 Neoplasm of unspecified behavior of bone, soft tissue, and skin: Secondary | ICD-10-CM

## 2023-11-01 DIAGNOSIS — D485 Neoplasm of uncertain behavior of skin: Secondary | ICD-10-CM

## 2023-11-01 DIAGNOSIS — W908XXA Exposure to other nonionizing radiation, initial encounter: Secondary | ICD-10-CM

## 2023-11-01 DIAGNOSIS — D1801 Hemangioma of skin and subcutaneous tissue: Secondary | ICD-10-CM

## 2023-11-01 DIAGNOSIS — D2272 Melanocytic nevi of left lower limb, including hip: Secondary | ICD-10-CM | POA: Diagnosis not present

## 2023-11-01 DIAGNOSIS — L578 Other skin changes due to chronic exposure to nonionizing radiation: Secondary | ICD-10-CM

## 2023-11-01 DIAGNOSIS — Z1283 Encounter for screening for malignant neoplasm of skin: Secondary | ICD-10-CM | POA: Diagnosis not present

## 2023-11-01 DIAGNOSIS — L821 Other seborrheic keratosis: Secondary | ICD-10-CM

## 2023-11-01 DIAGNOSIS — D229 Melanocytic nevi, unspecified: Secondary | ICD-10-CM

## 2023-11-01 NOTE — Patient Instructions (Addendum)

## 2023-11-01 NOTE — Progress Notes (Signed)
 Total Body Skin Exam (TBSE) Visit   Subjective  Alicia Delgado is a 73 y.o. female who presents for the following: Skin Cancer Screening and Full Body Skin Exam  Patient presents today for follow up visit for TBSE. Patient was last evaluated on 08/31/23. Patient denies medication changes. Patient reports she does not have spots, moles and lesions of concern to be evaluated. Patient reports throughout her lifetime she has had moderate sun exposure. Currently, patient reports if she has excessive sun exposure, she does apply sunscreen and/or wears protective coverings. Patient reports she does not have hx of bx. Patient reports  family history of skin cancers (Mom and Sister, Kurt of type). The patient has spots, moles and lesions to be evaluated, some may be new or changing and the patient has concerns that these could be cancer.  The following portions of the chart were reviewed this encounter and updated as appropriate: medications, allergies, medical history  Review of Systems:  No other skin or systemic complaints except as noted in HPI or Assessment and Plan.  Objective  Well appearing patient in no apparent distress; mood and affect are within normal limits.  A full examination was performed including scalp, head, eyes, ears, nose, lips, neck, chest, axillae, abdomen, back, buttocks, bilateral upper extremities, bilateral lower extremities, hands, feet, fingers, toes, fingernails, and toenails. All findings within normal limits unless otherwise noted below.   Relevant physical exam findings are noted in the Assessment and Plan.        Left Lower Leg - Anterior 8 mm irregular dark brown Macule  Assessment & Plan   LENTIGINES, SEBORRHEIC KERATOSES, HEMANGIOMAS - Benign normal skin lesions - Benign-appearing - Call for any changes  MELANOCYTIC NEVI - Tan-brown and/or pink-flesh-colored symmetric macules and papules - Benign appearing on exam today - Observation - Call  clinic for new or changing moles - Recommend daily use of broad spectrum spf 30+ sunscreen to sun-exposed areas.   Moderate ACTINIC DAMAGE - Chronic condition, secondary to cumulative UV/sun exposure - diffuse scaly erythematous macules with underlying dyspigmentation - Recommend daily broad spectrum sunscreen SPF 30+ to sun-exposed areas, reapply every 2 hours as needed.  - Staying in the shade or wearing long sleeves, sun glasses (UVA+UVB protection) and wide brim hats (4-inch brim around the entire circumference of the hat) are also recommended for sun protection.  - Call for new or changing lesions.  SKIN CANCER SCREENING PERFORMED TODAY.  NEOPLASM OF UNCERTAIN BEHAVIOR OF SKIN Left Lower Leg - Anterior Epidermal / dermal shaving  Lesion diameter (cm):  0.8 Informed consent: discussed and consent obtained   Timeout: patient name, date of birth, surgical site, and procedure verified   Procedure prep:  Patient was prepped and draped in usual sterile fashion Prep type:  Isopropyl alcohol Anesthesia: the lesion was anesthetized in a standard fashion   Anesthetic:  1% lidocaine w/ epinephrine 1-100,000 buffered w/ 8.4% NaHCO3 Instrument used: flexible razor blade   Hemostasis achieved with: pressure, aluminum chloride and electrodesiccation   Outcome: patient tolerated procedure well   Post-procedure details: sterile dressing applied and wound care instructions given   Dressing type: bandage and petrolatum   Specimen A - Surgical pathology Differential Diagnosis: R/O DN  Check Margins: No  Return in about 1 year (around 10/31/2024) for TBSE.  I, Ysmael Hires, am acting as Neurosurgeon for [employer], DO.  Documentation: I have reviewed the above documentation for accuracy and completeness, and I agree with the above.  Delon  Alm, DO

## 2023-11-02 LAB — SURGICAL PATHOLOGY

## 2023-11-03 ENCOUNTER — Ambulatory Visit: Payer: Self-pay | Admitting: Dermatology

## 2023-11-08 ENCOUNTER — Ambulatory Visit
Admission: RE | Admit: 2023-11-08 | Discharge: 2023-11-08 | Disposition: A | Discharge status: Home / Self Care | Payer: Medicare (Managed Care) | Source: Ambulatory Visit | Attending: Family | Admitting: Family

## 2023-11-08 DIAGNOSIS — Z1231 Encounter for screening mammogram for malignant neoplasm of breast: Secondary | ICD-10-CM

## 2024-04-25 ENCOUNTER — Ambulatory Visit (HOSPITAL_BASED_OUTPATIENT_CLINIC_OR_DEPARTMENT_OTHER): Admission: RE | Admit: 2024-04-25 | Payer: Medicare (Managed Care) | Source: Ambulatory Visit

## 2024-04-25 DIAGNOSIS — R5381 Other malaise: Secondary | ICD-10-CM | POA: Insufficient documentation

## 2024-04-25 DIAGNOSIS — E2839 Other primary ovarian failure: Secondary | ICD-10-CM | POA: Diagnosis present

## 2024-05-20 ENCOUNTER — Other Ambulatory Visit: Payer: Medicare (Managed Care)

## 2024-11-04 ENCOUNTER — Ambulatory Visit: Payer: Medicare (Managed Care) | Admitting: Dermatology
